# Patient Record
Sex: Male | Born: 1945 | Race: Black or African American | Hispanic: No
Health system: Southern US, Community
[De-identification: ages and names within clinical notes are randomized; demographics above are authoritative.]

---

## 2006-03-24 ENCOUNTER — Other Ambulatory Visit: Payer: Self-pay

## 2006-03-24 ENCOUNTER — Emergency Department: Payer: Self-pay | Admitting: Emergency Medicine

## 2006-09-05 ENCOUNTER — Emergency Department: Payer: Self-pay

## 2006-09-06 ENCOUNTER — Emergency Department: Payer: Self-pay | Admitting: Emergency Medicine

## 2006-09-15 ENCOUNTER — Emergency Department: Payer: Self-pay | Admitting: Emergency Medicine

## 2007-05-24 ENCOUNTER — Emergency Department: Payer: Self-pay | Admitting: Emergency Medicine

## 2007-11-25 ENCOUNTER — Emergency Department: Payer: Self-pay | Admitting: Emergency Medicine

## 2007-12-28 ENCOUNTER — Emergency Department: Payer: Self-pay | Admitting: Internal Medicine

## 2008-12-13 ENCOUNTER — Encounter: Payer: Self-pay | Admitting: General Practice

## 2008-12-19 ENCOUNTER — Encounter: Payer: Self-pay | Admitting: General Practice

## 2010-02-24 ENCOUNTER — Emergency Department: Payer: Self-pay | Admitting: Emergency Medicine

## 2010-09-20 ENCOUNTER — Inpatient Hospital Stay: Payer: Self-pay | Admitting: Internal Medicine

## 2012-01-23 ENCOUNTER — Emergency Department: Payer: Self-pay | Admitting: *Deleted

## 2012-01-23 LAB — URINALYSIS, COMPLETE
Bacteria: NONE SEEN
Bilirubin,UR: NEGATIVE
Blood: NEGATIVE
Glucose,UR: NEGATIVE mg/dL (ref 0–75)
Protein: NEGATIVE
RBC,UR: 1 /HPF (ref 0–5)
Squamous Epithelial: <1

## 2012-01-23 LAB — CBC
MCHC: 34.2 g/dL (ref 32.0–36.0)
MCV: 95 fL (ref 80–100)
Platelet: 188 10*3/uL (ref 150–440)
RBC: 4.43 10*6/uL (ref 4.40–5.90)
WBC: 7 10*3/uL (ref 3.8–10.6)

## 2012-01-23 LAB — COMPREHENSIVE METABOLIC PANEL
Albumin: 3.7 g/dL (ref 3.4–5.0)
Anion Gap: 10 (ref 7–16)
BUN: 20 mg/dL — ABNORMAL HIGH (ref 7–18)
Chloride: 104 mmol/L (ref 98–107)
Co2: 26 mmol/L (ref 21–32)
Creatinine: 1.26 mg/dL (ref 0.60–1.30)
EGFR (African American): >60
EGFR (Non-African Amer.): >60
Glucose: 94 mg/dL (ref 65–99)
SGOT(AST): 8 U/L — ABNORMAL LOW (ref 15–37)
SGPT (ALT): 12 U/L
Total Protein: 6.9 g/dL (ref 6.4–8.2)

## 2012-01-23 LAB — LIPASE, BLOOD: Lipase: 103 U/L (ref 73–393)

## 2012-01-23 LAB — TROPONIN I: Troponin-I: <0.02 ng/mL

## 2013-06-15 ENCOUNTER — Emergency Department: Payer: Self-pay | Admitting: Emergency Medicine

## 2014-11-04 ENCOUNTER — Emergency Department: Payer: Self-pay | Admitting: Emergency Medicine

## 2014-11-22 ENCOUNTER — Emergency Department: Payer: Self-pay | Admitting: Emergency Medicine

## 2014-11-22 DIAGNOSIS — C719 Malignant neoplasm of brain, unspecified: Secondary | ICD-10-CM | POA: Diagnosis not present

## 2014-11-22 DIAGNOSIS — G9389 Other specified disorders of brain: Secondary | ICD-10-CM | POA: Diagnosis not present

## 2014-11-22 DIAGNOSIS — R4182 Altered mental status, unspecified: Secondary | ICD-10-CM | POA: Diagnosis not present

## 2014-11-22 DIAGNOSIS — I10 Essential (primary) hypertension: Secondary | ICD-10-CM | POA: Diagnosis not present

## 2014-11-22 DIAGNOSIS — R220 Localized swelling, mass and lump, head: Secondary | ICD-10-CM | POA: Diagnosis not present

## 2014-11-22 DIAGNOSIS — M6281 Muscle weakness (generalized): Secondary | ICD-10-CM | POA: Diagnosis not present

## 2014-11-22 DIAGNOSIS — R262 Difficulty in walking, not elsewhere classified: Secondary | ICD-10-CM | POA: Diagnosis not present

## 2014-11-22 DIAGNOSIS — F17210 Nicotine dependence, cigarettes, uncomplicated: Secondary | ICD-10-CM | POA: Diagnosis not present

## 2014-11-22 DIAGNOSIS — Z8709 Personal history of other diseases of the respiratory system: Secondary | ICD-10-CM | POA: Diagnosis not present

## 2014-11-22 DIAGNOSIS — F1010 Alcohol abuse, uncomplicated: Secondary | ICD-10-CM | POA: Diagnosis not present

## 2014-11-22 DIAGNOSIS — C801 Malignant (primary) neoplasm, unspecified: Secondary | ICD-10-CM | POA: Diagnosis not present

## 2014-11-22 DIAGNOSIS — Z048 Encounter for examination and observation for other specified reasons: Secondary | ICD-10-CM | POA: Diagnosis not present

## 2014-11-22 DIAGNOSIS — R930 Abnormal findings on diagnostic imaging of skull and head, not elsewhere classified: Secondary | ICD-10-CM | POA: Diagnosis not present

## 2014-11-22 DIAGNOSIS — Z8711 Personal history of peptic ulcer disease: Secondary | ICD-10-CM | POA: Diagnosis not present

## 2014-11-22 DIAGNOSIS — K219 Gastro-esophageal reflux disease without esophagitis: Secondary | ICD-10-CM | POA: Diagnosis not present

## 2014-11-22 DIAGNOSIS — E785 Hyperlipidemia, unspecified: Secondary | ICD-10-CM | POA: Diagnosis not present

## 2014-11-22 DIAGNOSIS — Z720 Tobacco use: Secondary | ICD-10-CM | POA: Diagnosis not present

## 2014-11-22 DIAGNOSIS — J984 Other disorders of lung: Secondary | ICD-10-CM | POA: Diagnosis not present

## 2014-11-22 DIAGNOSIS — Z79899 Other long term (current) drug therapy: Secondary | ICD-10-CM | POA: Diagnosis not present

## 2014-11-22 DIAGNOSIS — R52 Pain, unspecified: Secondary | ICD-10-CM | POA: Diagnosis not present

## 2014-11-22 DIAGNOSIS — G936 Cerebral edema: Secondary | ICD-10-CM | POA: Diagnosis not present

## 2014-11-22 DIAGNOSIS — R410 Disorientation, unspecified: Secondary | ICD-10-CM | POA: Diagnosis not present

## 2014-11-22 DIAGNOSIS — D496 Neoplasm of unspecified behavior of brain: Secondary | ICD-10-CM | POA: Diagnosis not present

## 2014-11-22 DIAGNOSIS — Z5189 Encounter for other specified aftercare: Secondary | ICD-10-CM | POA: Diagnosis not present

## 2014-11-22 DIAGNOSIS — Z803 Family history of malignant neoplasm of breast: Secondary | ICD-10-CM | POA: Diagnosis not present

## 2014-11-22 DIAGNOSIS — G939 Disorder of brain, unspecified: Secondary | ICD-10-CM | POA: Diagnosis not present

## 2014-11-22 DIAGNOSIS — G935 Compression of brain: Secondary | ICD-10-CM | POA: Diagnosis not present

## 2014-11-22 DIAGNOSIS — Z9889 Other specified postprocedural states: Secondary | ICD-10-CM | POA: Diagnosis not present

## 2014-11-22 DIAGNOSIS — F1490 Cocaine use, unspecified, uncomplicated: Secondary | ICD-10-CM | POA: Diagnosis not present

## 2014-11-22 LAB — COMPREHENSIVE METABOLIC PANEL
ALT: 12 U/L — AB
ANION GAP: 6 — AB (ref 7–16)
AST: 17 U/L (ref 15–37)
Albumin: 3.5 g/dL (ref 3.4–5.0)
Alkaline Phosphatase: 105 U/L
BUN: 9 mg/dL (ref 7–18)
Bilirubin,Total: 0.7 mg/dL (ref 0.2–1.0)
CALCIUM: 8.4 mg/dL — AB (ref 8.5–10.1)
CHLORIDE: 108 mmol/L — AB (ref 98–107)
CO2: 27 mmol/L (ref 21–32)
Creatinine: 1.07 mg/dL (ref 0.60–1.30)
Glucose: 89 mg/dL (ref 65–99)
Osmolality: 279 (ref 275–301)
POTASSIUM: 3.7 mmol/L (ref 3.5–5.1)
Sodium: 141 mmol/L (ref 136–145)
TOTAL PROTEIN: 7.5 g/dL (ref 6.4–8.2)

## 2014-11-22 LAB — URINALYSIS, COMPLETE
Bilirubin,UR: NEGATIVE
Blood: NEGATIVE
GLUCOSE, UR: NEGATIVE mg/dL (ref 0–75)
Ketone: NEGATIVE
Nitrite: NEGATIVE
Ph: 5 (ref 4.5–8.0)
Protein: NEGATIVE
Specific Gravity: 1.012 (ref 1.003–1.030)
Squamous Epithelial: <1
WBC UR: 10 /HPF (ref 0–5)

## 2014-11-22 LAB — ETHANOL: Ethanol: <3 mg/dL

## 2014-11-22 LAB — DRUG SCREEN, URINE

## 2014-11-22 LAB — CBC
HCT: 37.7 % — ABNORMAL LOW (ref 40.0–52.0)
HGB: 12.4 g/dL — AB (ref 13.0–18.0)
MCH: 30.1 pg (ref 26.0–34.0)
MCHC: 32.9 g/dL (ref 32.0–36.0)
MCV: 92 fL (ref 80–100)
PLATELETS: 229 10*3/uL (ref 150–440)
RBC: 4.12 10*6/uL — AB (ref 4.40–5.90)
RDW: 14.6 % — ABNORMAL HIGH (ref 11.5–14.5)
WBC: 8.7 10*3/uL (ref 3.8–10.6)

## 2014-11-22 LAB — PROTIME-INR
INR: 1
Prothrombin Time: 13.1 secs (ref 11.5–14.7)

## 2014-11-22 LAB — TROPONIN I: Troponin-I: <0.02 ng/mL

## 2014-11-22 LAB — AMMONIA: Ammonia, Plasma: 12 mcmol/L (ref 11–32)

## 2014-11-22 LAB — TSH: Thyroid Stimulating Horm: 0.511 u[IU]/mL

## 2014-11-30 DIAGNOSIS — Z5189 Encounter for other specified aftercare: Secondary | ICD-10-CM | POA: Diagnosis not present

## 2014-11-30 DIAGNOSIS — Z720 Tobacco use: Secondary | ICD-10-CM | POA: Diagnosis not present

## 2014-11-30 DIAGNOSIS — M6281 Muscle weakness (generalized): Secondary | ICD-10-CM | POA: Diagnosis not present

## 2014-11-30 DIAGNOSIS — I10 Essential (primary) hypertension: Secondary | ICD-10-CM | POA: Diagnosis not present

## 2014-11-30 DIAGNOSIS — C801 Malignant (primary) neoplasm, unspecified: Secondary | ICD-10-CM | POA: Diagnosis not present

## 2014-11-30 DIAGNOSIS — R262 Difficulty in walking, not elsewhere classified: Secondary | ICD-10-CM | POA: Diagnosis not present

## 2014-11-30 DIAGNOSIS — E784 Other hyperlipidemia: Secondary | ICD-10-CM | POA: Diagnosis not present

## 2014-11-30 DIAGNOSIS — C719 Malignant neoplasm of brain, unspecified: Secondary | ICD-10-CM | POA: Diagnosis not present

## 2014-11-30 DIAGNOSIS — E785 Hyperlipidemia, unspecified: Secondary | ICD-10-CM | POA: Diagnosis not present

## 2014-11-30 DIAGNOSIS — Z4802 Encounter for removal of sutures: Secondary | ICD-10-CM | POA: Diagnosis not present

## 2014-11-30 DIAGNOSIS — K219 Gastro-esophageal reflux disease without esophagitis: Secondary | ICD-10-CM | POA: Diagnosis not present

## 2014-12-04 DIAGNOSIS — E784 Other hyperlipidemia: Secondary | ICD-10-CM | POA: Diagnosis not present

## 2014-12-04 DIAGNOSIS — I10 Essential (primary) hypertension: Secondary | ICD-10-CM | POA: Diagnosis not present

## 2014-12-07 DIAGNOSIS — Z4802 Encounter for removal of sutures: Secondary | ICD-10-CM | POA: Diagnosis not present

## 2014-12-10 ENCOUNTER — Ambulatory Visit: Payer: Self-pay | Admitting: Family Medicine

## 2014-12-10 LAB — CBC WITH DIFFERENTIAL/PLATELET
BANDS NEUTROPHIL: 1 %
Basophil: 1 %
HCT: 34.4 % — ABNORMAL LOW (ref 40.0–52.0)
HGB: 11.2 g/dL — AB (ref 13.0–18.0)
LYMPHS PCT: 22 %
MCH: 30.5 pg (ref 26.0–34.0)
MCHC: 32.5 g/dL (ref 32.0–36.0)
MCV: 94 fL (ref 80–100)
MONOS PCT: 5 %
NRBC/100 WBC: 1 /
Platelet: 219 10*3/uL (ref 150–440)
RBC: 3.66 10*6/uL — AB (ref 4.40–5.90)
RDW: 16.3 % — ABNORMAL HIGH (ref 11.5–14.5)
Segmented Neutrophils: 71 %
WBC: 12.2 10*3/uL — ABNORMAL HIGH (ref 3.8–10.6)

## 2014-12-10 LAB — COMPREHENSIVE METABOLIC PANEL
ALBUMIN: 2.8 g/dL — AB (ref 3.4–5.0)
ALK PHOS: 109 U/L
ALT: 55 U/L
Anion Gap: 12 (ref 7–16)
BUN: 20 mg/dL — ABNORMAL HIGH (ref 7–18)
Bilirubin,Total: 0.4 mg/dL (ref 0.2–1.0)
CREATININE: 0.98 mg/dL (ref 0.60–1.30)
Calcium, Total: 8.3 mg/dL — ABNORMAL LOW (ref 8.5–10.1)
Chloride: 103 mmol/L (ref 98–107)
Co2: 25 mmol/L (ref 21–32)
EGFR (African American): >60
EGFR (Non-African Amer.): >60
GLUCOSE: 96 mg/dL (ref 65–99)
OSMOLALITY: 282 (ref 275–301)
Potassium: 4.2 mmol/L (ref 3.5–5.1)
SGOT(AST): 24 U/L (ref 15–37)
Sodium: 140 mmol/L (ref 136–145)
Total Protein: 6 g/dL — ABNORMAL LOW (ref 6.4–8.2)

## 2014-12-12 DIAGNOSIS — E784 Other hyperlipidemia: Secondary | ICD-10-CM | POA: Diagnosis not present

## 2014-12-12 DIAGNOSIS — I10 Essential (primary) hypertension: Secondary | ICD-10-CM | POA: Diagnosis not present

## 2014-12-12 DIAGNOSIS — C719 Malignant neoplasm of brain, unspecified: Secondary | ICD-10-CM | POA: Diagnosis not present

## 2014-12-21 DIAGNOSIS — E784 Other hyperlipidemia: Secondary | ICD-10-CM | POA: Diagnosis not present

## 2014-12-21 DIAGNOSIS — C719 Malignant neoplasm of brain, unspecified: Secondary | ICD-10-CM | POA: Diagnosis not present

## 2014-12-21 DIAGNOSIS — I10 Essential (primary) hypertension: Secondary | ICD-10-CM | POA: Diagnosis not present

## 2014-12-22 DIAGNOSIS — C719 Malignant neoplasm of brain, unspecified: Secondary | ICD-10-CM | POA: Diagnosis not present

## 2014-12-22 DIAGNOSIS — I5021 Acute systolic (congestive) heart failure: Secondary | ICD-10-CM | POA: Diagnosis not present

## 2014-12-22 DIAGNOSIS — J90 Pleural effusion, not elsewhere classified: Secondary | ICD-10-CM | POA: Diagnosis not present

## 2014-12-23 DIAGNOSIS — M6281 Muscle weakness (generalized): Secondary | ICD-10-CM | POA: Diagnosis not present

## 2014-12-23 DIAGNOSIS — C799 Secondary malignant neoplasm of unspecified site: Secondary | ICD-10-CM | POA: Diagnosis not present

## 2014-12-23 DIAGNOSIS — C719 Malignant neoplasm of brain, unspecified: Secondary | ICD-10-CM | POA: Diagnosis not present

## 2014-12-24 DIAGNOSIS — E785 Hyperlipidemia, unspecified: Secondary | ICD-10-CM | POA: Diagnosis not present

## 2014-12-24 DIAGNOSIS — D649 Anemia, unspecified: Secondary | ICD-10-CM | POA: Diagnosis not present

## 2014-12-26 DIAGNOSIS — R079 Chest pain, unspecified: Secondary | ICD-10-CM | POA: Diagnosis not present

## 2014-12-26 DIAGNOSIS — R05 Cough: Secondary | ICD-10-CM | POA: Diagnosis not present

## 2015-01-04 ENCOUNTER — Ambulatory Visit: Payer: Self-pay | Admitting: Hematology and Oncology

## 2015-01-04 DIAGNOSIS — Z8711 Personal history of peptic ulcer disease: Secondary | ICD-10-CM | POA: Diagnosis not present

## 2015-01-04 DIAGNOSIS — C719 Malignant neoplasm of brain, unspecified: Secondary | ICD-10-CM | POA: Diagnosis not present

## 2015-01-04 DIAGNOSIS — F17210 Nicotine dependence, cigarettes, uncomplicated: Secondary | ICD-10-CM | POA: Diagnosis not present

## 2015-01-04 DIAGNOSIS — E780 Pure hypercholesterolemia: Secondary | ICD-10-CM | POA: Diagnosis not present

## 2015-01-04 DIAGNOSIS — R011 Cardiac murmur, unspecified: Secondary | ICD-10-CM | POA: Diagnosis not present

## 2015-01-04 DIAGNOSIS — Z510 Encounter for antineoplastic radiation therapy: Secondary | ICD-10-CM | POA: Diagnosis not present

## 2015-01-04 DIAGNOSIS — Z79899 Other long term (current) drug therapy: Secondary | ICD-10-CM | POA: Diagnosis not present

## 2015-01-04 DIAGNOSIS — Z8709 Personal history of other diseases of the respiratory system: Secondary | ICD-10-CM | POA: Diagnosis not present

## 2015-01-04 DIAGNOSIS — Z801 Family history of malignant neoplasm of trachea, bronchus and lung: Secondary | ICD-10-CM | POA: Diagnosis not present

## 2015-01-04 DIAGNOSIS — I10 Essential (primary) hypertension: Secondary | ICD-10-CM | POA: Diagnosis not present

## 2015-01-04 DIAGNOSIS — R0602 Shortness of breath: Secondary | ICD-10-CM | POA: Diagnosis not present

## 2015-01-04 DIAGNOSIS — Z8042 Family history of malignant neoplasm of prostate: Secondary | ICD-10-CM | POA: Diagnosis not present

## 2015-01-04 DIAGNOSIS — R531 Weakness: Secondary | ICD-10-CM | POA: Diagnosis not present

## 2015-01-04 DIAGNOSIS — B379 Candidiasis, unspecified: Secondary | ICD-10-CM | POA: Diagnosis not present

## 2015-01-04 DIAGNOSIS — K219 Gastro-esophageal reflux disease without esophagitis: Secondary | ICD-10-CM | POA: Diagnosis not present

## 2015-01-04 DIAGNOSIS — I252 Old myocardial infarction: Secondary | ICD-10-CM | POA: Diagnosis not present

## 2015-01-04 DIAGNOSIS — Z800 Family history of malignant neoplasm of digestive organs: Secondary | ICD-10-CM | POA: Diagnosis not present

## 2015-01-05 DIAGNOSIS — I10 Essential (primary) hypertension: Secondary | ICD-10-CM | POA: Diagnosis not present

## 2015-01-05 DIAGNOSIS — E784 Other hyperlipidemia: Secondary | ICD-10-CM | POA: Diagnosis not present

## 2015-01-11 DIAGNOSIS — F17210 Nicotine dependence, cigarettes, uncomplicated: Secondary | ICD-10-CM | POA: Diagnosis not present

## 2015-01-11 DIAGNOSIS — R011 Cardiac murmur, unspecified: Secondary | ICD-10-CM | POA: Diagnosis not present

## 2015-01-11 DIAGNOSIS — Z8042 Family history of malignant neoplasm of prostate: Secondary | ICD-10-CM | POA: Diagnosis not present

## 2015-01-11 DIAGNOSIS — Z800 Family history of malignant neoplasm of digestive organs: Secondary | ICD-10-CM | POA: Diagnosis not present

## 2015-01-11 DIAGNOSIS — R0602 Shortness of breath: Secondary | ICD-10-CM | POA: Diagnosis not present

## 2015-01-11 DIAGNOSIS — B379 Candidiasis, unspecified: Secondary | ICD-10-CM | POA: Diagnosis not present

## 2015-01-11 DIAGNOSIS — R531 Weakness: Secondary | ICD-10-CM | POA: Diagnosis not present

## 2015-01-11 DIAGNOSIS — Z510 Encounter for antineoplastic radiation therapy: Secondary | ICD-10-CM | POA: Diagnosis not present

## 2015-01-11 DIAGNOSIS — C719 Malignant neoplasm of brain, unspecified: Secondary | ICD-10-CM | POA: Diagnosis not present

## 2015-01-11 DIAGNOSIS — Z8709 Personal history of other diseases of the respiratory system: Secondary | ICD-10-CM | POA: Diagnosis not present

## 2015-01-11 DIAGNOSIS — E780 Pure hypercholesterolemia: Secondary | ICD-10-CM | POA: Diagnosis not present

## 2015-01-11 DIAGNOSIS — K219 Gastro-esophageal reflux disease without esophagitis: Secondary | ICD-10-CM | POA: Diagnosis not present

## 2015-01-11 DIAGNOSIS — Z801 Family history of malignant neoplasm of trachea, bronchus and lung: Secondary | ICD-10-CM | POA: Diagnosis not present

## 2015-01-11 DIAGNOSIS — I10 Essential (primary) hypertension: Secondary | ICD-10-CM | POA: Diagnosis not present

## 2015-01-11 DIAGNOSIS — I252 Old myocardial infarction: Secondary | ICD-10-CM | POA: Diagnosis not present

## 2015-01-11 DIAGNOSIS — Z8711 Personal history of peptic ulcer disease: Secondary | ICD-10-CM | POA: Diagnosis not present

## 2015-01-11 DIAGNOSIS — Z79899 Other long term (current) drug therapy: Secondary | ICD-10-CM | POA: Diagnosis not present

## 2015-01-12 DIAGNOSIS — Z800 Family history of malignant neoplasm of digestive organs: Secondary | ICD-10-CM | POA: Diagnosis not present

## 2015-01-12 DIAGNOSIS — Z510 Encounter for antineoplastic radiation therapy: Secondary | ICD-10-CM | POA: Diagnosis not present

## 2015-01-12 DIAGNOSIS — R0602 Shortness of breath: Secondary | ICD-10-CM | POA: Diagnosis not present

## 2015-01-12 DIAGNOSIS — Z8042 Family history of malignant neoplasm of prostate: Secondary | ICD-10-CM | POA: Diagnosis not present

## 2015-01-12 DIAGNOSIS — Z801 Family history of malignant neoplasm of trachea, bronchus and lung: Secondary | ICD-10-CM | POA: Diagnosis not present

## 2015-01-12 DIAGNOSIS — F17210 Nicotine dependence, cigarettes, uncomplicated: Secondary | ICD-10-CM | POA: Diagnosis not present

## 2015-01-12 DIAGNOSIS — Z8709 Personal history of other diseases of the respiratory system: Secondary | ICD-10-CM | POA: Diagnosis not present

## 2015-01-12 DIAGNOSIS — E780 Pure hypercholesterolemia: Secondary | ICD-10-CM | POA: Diagnosis not present

## 2015-01-12 DIAGNOSIS — I252 Old myocardial infarction: Secondary | ICD-10-CM | POA: Diagnosis not present

## 2015-01-12 DIAGNOSIS — Z8711 Personal history of peptic ulcer disease: Secondary | ICD-10-CM | POA: Diagnosis not present

## 2015-01-12 DIAGNOSIS — R531 Weakness: Secondary | ICD-10-CM | POA: Diagnosis not present

## 2015-01-12 DIAGNOSIS — B379 Candidiasis, unspecified: Secondary | ICD-10-CM | POA: Diagnosis not present

## 2015-01-12 DIAGNOSIS — K219 Gastro-esophageal reflux disease without esophagitis: Secondary | ICD-10-CM | POA: Diagnosis not present

## 2015-01-12 DIAGNOSIS — C719 Malignant neoplasm of brain, unspecified: Secondary | ICD-10-CM | POA: Diagnosis not present

## 2015-01-12 DIAGNOSIS — R011 Cardiac murmur, unspecified: Secondary | ICD-10-CM | POA: Diagnosis not present

## 2015-01-12 DIAGNOSIS — Z79899 Other long term (current) drug therapy: Secondary | ICD-10-CM | POA: Diagnosis not present

## 2015-01-12 DIAGNOSIS — I10 Essential (primary) hypertension: Secondary | ICD-10-CM | POA: Diagnosis not present

## 2015-01-17 ENCOUNTER — Ambulatory Visit
Admit: 2015-01-17 | Disposition: A | Discharge status: Home / Self Care | Payer: Self-pay | Attending: Hematology and Oncology | Admitting: Hematology and Oncology

## 2015-01-17 DIAGNOSIS — C719 Malignant neoplasm of brain, unspecified: Secondary | ICD-10-CM | POA: Diagnosis not present

## 2015-01-17 DIAGNOSIS — I10 Essential (primary) hypertension: Secondary | ICD-10-CM | POA: Diagnosis not present

## 2015-01-17 DIAGNOSIS — E784 Other hyperlipidemia: Secondary | ICD-10-CM | POA: Diagnosis not present

## 2015-01-18 DIAGNOSIS — Z79899 Other long term (current) drug therapy: Secondary | ICD-10-CM | POA: Diagnosis not present

## 2015-01-18 DIAGNOSIS — Z1159 Encounter for screening for other viral diseases: Secondary | ICD-10-CM | POA: Diagnosis not present

## 2015-01-18 DIAGNOSIS — R011 Cardiac murmur, unspecified: Secondary | ICD-10-CM | POA: Diagnosis not present

## 2015-01-18 DIAGNOSIS — E871 Hypo-osmolality and hyponatremia: Secondary | ICD-10-CM | POA: Diagnosis not present

## 2015-01-18 DIAGNOSIS — R531 Weakness: Secondary | ICD-10-CM | POA: Diagnosis not present

## 2015-01-18 DIAGNOSIS — E860 Dehydration: Secondary | ICD-10-CM | POA: Diagnosis not present

## 2015-01-18 DIAGNOSIS — Z8042 Family history of malignant neoplasm of prostate: Secondary | ICD-10-CM | POA: Diagnosis not present

## 2015-01-18 DIAGNOSIS — Z8711 Personal history of peptic ulcer disease: Secondary | ICD-10-CM | POA: Diagnosis not present

## 2015-01-18 DIAGNOSIS — N390 Urinary tract infection, site not specified: Secondary | ICD-10-CM | POA: Diagnosis not present

## 2015-01-18 DIAGNOSIS — Z801 Family history of malignant neoplasm of trachea, bronchus and lung: Secondary | ICD-10-CM | POA: Diagnosis not present

## 2015-01-18 DIAGNOSIS — M2550 Pain in unspecified joint: Secondary | ICD-10-CM | POA: Diagnosis not present

## 2015-01-18 DIAGNOSIS — R4182 Altered mental status, unspecified: Secondary | ICD-10-CM | POA: Diagnosis not present

## 2015-01-18 DIAGNOSIS — R197 Diarrhea, unspecified: Secondary | ICD-10-CM | POA: Diagnosis not present

## 2015-01-18 DIAGNOSIS — C719 Malignant neoplasm of brain, unspecified: Secondary | ICD-10-CM | POA: Diagnosis not present

## 2015-01-18 DIAGNOSIS — R7989 Other specified abnormal findings of blood chemistry: Secondary | ICD-10-CM | POA: Diagnosis not present

## 2015-01-18 DIAGNOSIS — I10 Essential (primary) hypertension: Secondary | ICD-10-CM | POA: Diagnosis not present

## 2015-01-18 DIAGNOSIS — K219 Gastro-esophageal reflux disease without esophagitis: Secondary | ICD-10-CM | POA: Diagnosis not present

## 2015-01-18 DIAGNOSIS — I252 Old myocardial infarction: Secondary | ICD-10-CM | POA: Diagnosis not present

## 2015-01-18 DIAGNOSIS — E780 Pure hypercholesterolemia: Secondary | ICD-10-CM | POA: Diagnosis not present

## 2015-01-18 DIAGNOSIS — F17210 Nicotine dependence, cigarettes, uncomplicated: Secondary | ICD-10-CM | POA: Diagnosis not present

## 2015-01-18 DIAGNOSIS — R109 Unspecified abdominal pain: Secondary | ICD-10-CM | POA: Diagnosis not present

## 2015-01-18 DIAGNOSIS — R140 Abdominal distension (gaseous): Secondary | ICD-10-CM | POA: Diagnosis not present

## 2015-01-20 DIAGNOSIS — Z8042 Family history of malignant neoplasm of prostate: Secondary | ICD-10-CM | POA: Diagnosis not present

## 2015-01-20 DIAGNOSIS — E860 Dehydration: Secondary | ICD-10-CM | POA: Diagnosis not present

## 2015-01-20 DIAGNOSIS — E780 Pure hypercholesterolemia: Secondary | ICD-10-CM | POA: Diagnosis not present

## 2015-01-20 DIAGNOSIS — I10 Essential (primary) hypertension: Secondary | ICD-10-CM | POA: Diagnosis not present

## 2015-01-20 DIAGNOSIS — R011 Cardiac murmur, unspecified: Secondary | ICD-10-CM | POA: Diagnosis not present

## 2015-01-20 DIAGNOSIS — Z8711 Personal history of peptic ulcer disease: Secondary | ICD-10-CM | POA: Diagnosis not present

## 2015-01-20 DIAGNOSIS — I252 Old myocardial infarction: Secondary | ICD-10-CM | POA: Diagnosis not present

## 2015-01-20 DIAGNOSIS — F17210 Nicotine dependence, cigarettes, uncomplicated: Secondary | ICD-10-CM | POA: Diagnosis not present

## 2015-01-20 DIAGNOSIS — E871 Hypo-osmolality and hyponatremia: Secondary | ICD-10-CM | POA: Diagnosis not present

## 2015-01-20 DIAGNOSIS — Z801 Family history of malignant neoplasm of trachea, bronchus and lung: Secondary | ICD-10-CM | POA: Diagnosis not present

## 2015-01-20 DIAGNOSIS — K219 Gastro-esophageal reflux disease without esophagitis: Secondary | ICD-10-CM | POA: Diagnosis not present

## 2015-01-20 DIAGNOSIS — R109 Unspecified abdominal pain: Secondary | ICD-10-CM | POA: Diagnosis not present

## 2015-01-20 DIAGNOSIS — R7989 Other specified abnormal findings of blood chemistry: Secondary | ICD-10-CM | POA: Diagnosis not present

## 2015-01-20 DIAGNOSIS — R531 Weakness: Secondary | ICD-10-CM | POA: Diagnosis not present

## 2015-01-20 DIAGNOSIS — N390 Urinary tract infection, site not specified: Secondary | ICD-10-CM | POA: Diagnosis not present

## 2015-01-20 DIAGNOSIS — R140 Abdominal distension (gaseous): Secondary | ICD-10-CM | POA: Diagnosis not present

## 2015-01-20 DIAGNOSIS — Z79899 Other long term (current) drug therapy: Secondary | ICD-10-CM | POA: Diagnosis not present

## 2015-01-20 DIAGNOSIS — R197 Diarrhea, unspecified: Secondary | ICD-10-CM | POA: Diagnosis not present

## 2015-01-20 DIAGNOSIS — R4182 Altered mental status, unspecified: Secondary | ICD-10-CM | POA: Diagnosis not present

## 2015-01-20 DIAGNOSIS — C719 Malignant neoplasm of brain, unspecified: Secondary | ICD-10-CM | POA: Diagnosis not present

## 2015-01-20 DIAGNOSIS — Z1159 Encounter for screening for other viral diseases: Secondary | ICD-10-CM | POA: Diagnosis not present

## 2015-01-21 DIAGNOSIS — M2550 Pain in unspecified joint: Secondary | ICD-10-CM | POA: Diagnosis not present

## 2015-01-23 DIAGNOSIS — F17210 Nicotine dependence, cigarettes, uncomplicated: Secondary | ICD-10-CM | POA: Diagnosis not present

## 2015-01-23 DIAGNOSIS — Z8711 Personal history of peptic ulcer disease: Secondary | ICD-10-CM | POA: Diagnosis not present

## 2015-01-23 DIAGNOSIS — R531 Weakness: Secondary | ICD-10-CM | POA: Diagnosis not present

## 2015-01-23 DIAGNOSIS — C719 Malignant neoplasm of brain, unspecified: Secondary | ICD-10-CM | POA: Diagnosis not present

## 2015-01-23 DIAGNOSIS — Z801 Family history of malignant neoplasm of trachea, bronchus and lung: Secondary | ICD-10-CM | POA: Diagnosis not present

## 2015-01-23 DIAGNOSIS — Z8042 Family history of malignant neoplasm of prostate: Secondary | ICD-10-CM | POA: Diagnosis not present

## 2015-01-23 DIAGNOSIS — N390 Urinary tract infection, site not specified: Secondary | ICD-10-CM | POA: Diagnosis not present

## 2015-01-23 DIAGNOSIS — R140 Abdominal distension (gaseous): Secondary | ICD-10-CM | POA: Diagnosis not present

## 2015-01-23 DIAGNOSIS — I10 Essential (primary) hypertension: Secondary | ICD-10-CM | POA: Diagnosis not present

## 2015-01-23 DIAGNOSIS — E780 Pure hypercholesterolemia: Secondary | ICD-10-CM | POA: Diagnosis not present

## 2015-01-23 DIAGNOSIS — R4182 Altered mental status, unspecified: Secondary | ICD-10-CM | POA: Diagnosis not present

## 2015-01-23 DIAGNOSIS — R011 Cardiac murmur, unspecified: Secondary | ICD-10-CM | POA: Diagnosis not present

## 2015-01-23 DIAGNOSIS — Z79899 Other long term (current) drug therapy: Secondary | ICD-10-CM | POA: Diagnosis not present

## 2015-01-23 DIAGNOSIS — K219 Gastro-esophageal reflux disease without esophagitis: Secondary | ICD-10-CM | POA: Diagnosis not present

## 2015-01-23 DIAGNOSIS — E871 Hypo-osmolality and hyponatremia: Secondary | ICD-10-CM | POA: Diagnosis not present

## 2015-01-23 DIAGNOSIS — R7989 Other specified abnormal findings of blood chemistry: Secondary | ICD-10-CM | POA: Diagnosis not present

## 2015-01-23 DIAGNOSIS — R197 Diarrhea, unspecified: Secondary | ICD-10-CM | POA: Diagnosis not present

## 2015-01-23 DIAGNOSIS — E860 Dehydration: Secondary | ICD-10-CM | POA: Diagnosis not present

## 2015-01-23 DIAGNOSIS — R109 Unspecified abdominal pain: Secondary | ICD-10-CM | POA: Diagnosis not present

## 2015-01-23 DIAGNOSIS — Z1159 Encounter for screening for other viral diseases: Secondary | ICD-10-CM | POA: Diagnosis not present

## 2015-01-23 DIAGNOSIS — I252 Old myocardial infarction: Secondary | ICD-10-CM | POA: Diagnosis not present

## 2015-01-24 DIAGNOSIS — R109 Unspecified abdominal pain: Secondary | ICD-10-CM | POA: Diagnosis not present

## 2015-01-24 DIAGNOSIS — Z8711 Personal history of peptic ulcer disease: Secondary | ICD-10-CM | POA: Diagnosis not present

## 2015-01-24 DIAGNOSIS — I252 Old myocardial infarction: Secondary | ICD-10-CM | POA: Diagnosis not present

## 2015-01-24 DIAGNOSIS — Z801 Family history of malignant neoplasm of trachea, bronchus and lung: Secondary | ICD-10-CM | POA: Diagnosis not present

## 2015-01-24 DIAGNOSIS — K219 Gastro-esophageal reflux disease without esophagitis: Secondary | ICD-10-CM | POA: Diagnosis not present

## 2015-01-24 DIAGNOSIS — R4182 Altered mental status, unspecified: Secondary | ICD-10-CM | POA: Diagnosis not present

## 2015-01-24 DIAGNOSIS — F17210 Nicotine dependence, cigarettes, uncomplicated: Secondary | ICD-10-CM | POA: Diagnosis not present

## 2015-01-24 DIAGNOSIS — Z8042 Family history of malignant neoplasm of prostate: Secondary | ICD-10-CM | POA: Diagnosis not present

## 2015-01-24 DIAGNOSIS — R140 Abdominal distension (gaseous): Secondary | ICD-10-CM | POA: Diagnosis not present

## 2015-01-24 DIAGNOSIS — Z79899 Other long term (current) drug therapy: Secondary | ICD-10-CM | POA: Diagnosis not present

## 2015-01-24 DIAGNOSIS — E780 Pure hypercholesterolemia: Secondary | ICD-10-CM | POA: Diagnosis not present

## 2015-01-24 DIAGNOSIS — C719 Malignant neoplasm of brain, unspecified: Secondary | ICD-10-CM | POA: Diagnosis not present

## 2015-01-24 DIAGNOSIS — E871 Hypo-osmolality and hyponatremia: Secondary | ICD-10-CM | POA: Diagnosis not present

## 2015-01-24 DIAGNOSIS — R531 Weakness: Secondary | ICD-10-CM | POA: Diagnosis not present

## 2015-01-24 DIAGNOSIS — R7989 Other specified abnormal findings of blood chemistry: Secondary | ICD-10-CM | POA: Diagnosis not present

## 2015-01-24 DIAGNOSIS — Z1159 Encounter for screening for other viral diseases: Secondary | ICD-10-CM | POA: Diagnosis not present

## 2015-01-24 DIAGNOSIS — R011 Cardiac murmur, unspecified: Secondary | ICD-10-CM | POA: Diagnosis not present

## 2015-01-24 DIAGNOSIS — I10 Essential (primary) hypertension: Secondary | ICD-10-CM | POA: Diagnosis not present

## 2015-01-24 DIAGNOSIS — N390 Urinary tract infection, site not specified: Secondary | ICD-10-CM | POA: Diagnosis not present

## 2015-01-24 DIAGNOSIS — R197 Diarrhea, unspecified: Secondary | ICD-10-CM | POA: Diagnosis not present

## 2015-01-24 DIAGNOSIS — E860 Dehydration: Secondary | ICD-10-CM | POA: Diagnosis not present

## 2015-01-25 DIAGNOSIS — K219 Gastro-esophageal reflux disease without esophagitis: Secondary | ICD-10-CM | POA: Diagnosis not present

## 2015-01-25 DIAGNOSIS — R197 Diarrhea, unspecified: Secondary | ICD-10-CM | POA: Diagnosis not present

## 2015-01-25 DIAGNOSIS — Z801 Family history of malignant neoplasm of trachea, bronchus and lung: Secondary | ICD-10-CM | POA: Diagnosis not present

## 2015-01-25 DIAGNOSIS — C719 Malignant neoplasm of brain, unspecified: Secondary | ICD-10-CM | POA: Diagnosis not present

## 2015-01-25 DIAGNOSIS — R140 Abdominal distension (gaseous): Secondary | ICD-10-CM | POA: Diagnosis not present

## 2015-01-25 DIAGNOSIS — R531 Weakness: Secondary | ICD-10-CM | POA: Diagnosis not present

## 2015-01-25 DIAGNOSIS — R011 Cardiac murmur, unspecified: Secondary | ICD-10-CM | POA: Diagnosis not present

## 2015-01-25 DIAGNOSIS — N390 Urinary tract infection, site not specified: Secondary | ICD-10-CM | POA: Diagnosis not present

## 2015-01-25 DIAGNOSIS — E860 Dehydration: Secondary | ICD-10-CM | POA: Diagnosis not present

## 2015-01-25 DIAGNOSIS — Z79899 Other long term (current) drug therapy: Secondary | ICD-10-CM | POA: Diagnosis not present

## 2015-01-25 DIAGNOSIS — Z7901 Long term (current) use of anticoagulants: Secondary | ICD-10-CM | POA: Diagnosis not present

## 2015-01-25 DIAGNOSIS — I10 Essential (primary) hypertension: Secondary | ICD-10-CM | POA: Diagnosis not present

## 2015-01-25 DIAGNOSIS — R109 Unspecified abdominal pain: Secondary | ICD-10-CM | POA: Diagnosis not present

## 2015-01-25 DIAGNOSIS — E780 Pure hypercholesterolemia: Secondary | ICD-10-CM | POA: Diagnosis not present

## 2015-01-25 DIAGNOSIS — Z1159 Encounter for screening for other viral diseases: Secondary | ICD-10-CM | POA: Diagnosis not present

## 2015-01-25 DIAGNOSIS — F17210 Nicotine dependence, cigarettes, uncomplicated: Secondary | ICD-10-CM | POA: Diagnosis not present

## 2015-01-25 DIAGNOSIS — R4182 Altered mental status, unspecified: Secondary | ICD-10-CM | POA: Diagnosis not present

## 2015-01-25 DIAGNOSIS — Z8042 Family history of malignant neoplasm of prostate: Secondary | ICD-10-CM | POA: Diagnosis not present

## 2015-01-25 DIAGNOSIS — I252 Old myocardial infarction: Secondary | ICD-10-CM | POA: Diagnosis not present

## 2015-01-25 DIAGNOSIS — Z8711 Personal history of peptic ulcer disease: Secondary | ICD-10-CM | POA: Diagnosis not present

## 2015-01-25 DIAGNOSIS — R7989 Other specified abnormal findings of blood chemistry: Secondary | ICD-10-CM | POA: Diagnosis not present

## 2015-01-25 DIAGNOSIS — E871 Hypo-osmolality and hyponatremia: Secondary | ICD-10-CM | POA: Diagnosis not present

## 2015-01-26 DIAGNOSIS — R140 Abdominal distension (gaseous): Secondary | ICD-10-CM | POA: Diagnosis not present

## 2015-01-26 DIAGNOSIS — R011 Cardiac murmur, unspecified: Secondary | ICD-10-CM | POA: Diagnosis not present

## 2015-01-26 DIAGNOSIS — E871 Hypo-osmolality and hyponatremia: Secondary | ICD-10-CM | POA: Diagnosis not present

## 2015-01-26 DIAGNOSIS — R197 Diarrhea, unspecified: Secondary | ICD-10-CM | POA: Diagnosis not present

## 2015-01-26 DIAGNOSIS — F17210 Nicotine dependence, cigarettes, uncomplicated: Secondary | ICD-10-CM | POA: Diagnosis not present

## 2015-01-26 DIAGNOSIS — R4182 Altered mental status, unspecified: Secondary | ICD-10-CM | POA: Diagnosis not present

## 2015-01-26 DIAGNOSIS — Z8711 Personal history of peptic ulcer disease: Secondary | ICD-10-CM | POA: Diagnosis not present

## 2015-01-26 DIAGNOSIS — R109 Unspecified abdominal pain: Secondary | ICD-10-CM | POA: Diagnosis not present

## 2015-01-26 DIAGNOSIS — I10 Essential (primary) hypertension: Secondary | ICD-10-CM | POA: Diagnosis not present

## 2015-01-26 DIAGNOSIS — Z1159 Encounter for screening for other viral diseases: Secondary | ICD-10-CM | POA: Diagnosis not present

## 2015-01-26 DIAGNOSIS — K219 Gastro-esophageal reflux disease without esophagitis: Secondary | ICD-10-CM | POA: Diagnosis not present

## 2015-01-26 DIAGNOSIS — Z79899 Other long term (current) drug therapy: Secondary | ICD-10-CM | POA: Diagnosis not present

## 2015-01-26 DIAGNOSIS — E860 Dehydration: Secondary | ICD-10-CM | POA: Diagnosis not present

## 2015-01-26 DIAGNOSIS — I252 Old myocardial infarction: Secondary | ICD-10-CM | POA: Diagnosis not present

## 2015-01-26 DIAGNOSIS — R531 Weakness: Secondary | ICD-10-CM | POA: Diagnosis not present

## 2015-01-26 DIAGNOSIS — Z8042 Family history of malignant neoplasm of prostate: Secondary | ICD-10-CM | POA: Diagnosis not present

## 2015-01-26 DIAGNOSIS — N390 Urinary tract infection, site not specified: Secondary | ICD-10-CM | POA: Diagnosis not present

## 2015-01-26 DIAGNOSIS — Z801 Family history of malignant neoplasm of trachea, bronchus and lung: Secondary | ICD-10-CM | POA: Diagnosis not present

## 2015-01-26 DIAGNOSIS — C719 Malignant neoplasm of brain, unspecified: Secondary | ICD-10-CM | POA: Diagnosis not present

## 2015-01-26 DIAGNOSIS — R7989 Other specified abnormal findings of blood chemistry: Secondary | ICD-10-CM | POA: Diagnosis not present

## 2015-01-26 DIAGNOSIS — E780 Pure hypercholesterolemia: Secondary | ICD-10-CM | POA: Diagnosis not present

## 2015-01-27 DIAGNOSIS — R011 Cardiac murmur, unspecified: Secondary | ICD-10-CM | POA: Diagnosis not present

## 2015-01-27 DIAGNOSIS — Z8711 Personal history of peptic ulcer disease: Secondary | ICD-10-CM | POA: Diagnosis not present

## 2015-01-27 DIAGNOSIS — N390 Urinary tract infection, site not specified: Secondary | ICD-10-CM | POA: Diagnosis not present

## 2015-01-27 DIAGNOSIS — E860 Dehydration: Secondary | ICD-10-CM | POA: Diagnosis not present

## 2015-01-27 DIAGNOSIS — E780 Pure hypercholesterolemia: Secondary | ICD-10-CM | POA: Diagnosis not present

## 2015-01-27 DIAGNOSIS — R140 Abdominal distension (gaseous): Secondary | ICD-10-CM | POA: Diagnosis not present

## 2015-01-27 DIAGNOSIS — I252 Old myocardial infarction: Secondary | ICD-10-CM | POA: Diagnosis not present

## 2015-01-27 DIAGNOSIS — Z801 Family history of malignant neoplasm of trachea, bronchus and lung: Secondary | ICD-10-CM | POA: Diagnosis not present

## 2015-01-27 DIAGNOSIS — R197 Diarrhea, unspecified: Secondary | ICD-10-CM | POA: Diagnosis not present

## 2015-01-27 DIAGNOSIS — R4182 Altered mental status, unspecified: Secondary | ICD-10-CM | POA: Diagnosis not present

## 2015-01-27 DIAGNOSIS — F17210 Nicotine dependence, cigarettes, uncomplicated: Secondary | ICD-10-CM | POA: Diagnosis not present

## 2015-01-27 DIAGNOSIS — K219 Gastro-esophageal reflux disease without esophagitis: Secondary | ICD-10-CM | POA: Diagnosis not present

## 2015-01-27 DIAGNOSIS — E871 Hypo-osmolality and hyponatremia: Secondary | ICD-10-CM | POA: Diagnosis not present

## 2015-01-27 DIAGNOSIS — R109 Unspecified abdominal pain: Secondary | ICD-10-CM | POA: Diagnosis not present

## 2015-01-27 DIAGNOSIS — I10 Essential (primary) hypertension: Secondary | ICD-10-CM | POA: Diagnosis not present

## 2015-01-27 DIAGNOSIS — Z79899 Other long term (current) drug therapy: Secondary | ICD-10-CM | POA: Diagnosis not present

## 2015-01-27 DIAGNOSIS — Z8042 Family history of malignant neoplasm of prostate: Secondary | ICD-10-CM | POA: Diagnosis not present

## 2015-01-27 DIAGNOSIS — R531 Weakness: Secondary | ICD-10-CM | POA: Diagnosis not present

## 2015-01-27 DIAGNOSIS — R7989 Other specified abnormal findings of blood chemistry: Secondary | ICD-10-CM | POA: Diagnosis not present

## 2015-01-27 DIAGNOSIS — Z1159 Encounter for screening for other viral diseases: Secondary | ICD-10-CM | POA: Diagnosis not present

## 2015-01-27 DIAGNOSIS — C719 Malignant neoplasm of brain, unspecified: Secondary | ICD-10-CM | POA: Diagnosis not present

## 2015-01-30 DIAGNOSIS — R011 Cardiac murmur, unspecified: Secondary | ICD-10-CM | POA: Diagnosis not present

## 2015-01-30 DIAGNOSIS — R197 Diarrhea, unspecified: Secondary | ICD-10-CM | POA: Diagnosis not present

## 2015-01-30 DIAGNOSIS — R140 Abdominal distension (gaseous): Secondary | ICD-10-CM | POA: Diagnosis not present

## 2015-01-30 DIAGNOSIS — F17210 Nicotine dependence, cigarettes, uncomplicated: Secondary | ICD-10-CM | POA: Diagnosis not present

## 2015-01-30 DIAGNOSIS — Z1159 Encounter for screening for other viral diseases: Secondary | ICD-10-CM | POA: Diagnosis not present

## 2015-01-30 DIAGNOSIS — R531 Weakness: Secondary | ICD-10-CM | POA: Diagnosis not present

## 2015-01-30 DIAGNOSIS — E860 Dehydration: Secondary | ICD-10-CM | POA: Diagnosis not present

## 2015-01-30 DIAGNOSIS — E780 Pure hypercholesterolemia: Secondary | ICD-10-CM | POA: Diagnosis not present

## 2015-01-30 DIAGNOSIS — I252 Old myocardial infarction: Secondary | ICD-10-CM | POA: Diagnosis not present

## 2015-01-30 DIAGNOSIS — E871 Hypo-osmolality and hyponatremia: Secondary | ICD-10-CM | POA: Diagnosis not present

## 2015-01-30 DIAGNOSIS — N390 Urinary tract infection, site not specified: Secondary | ICD-10-CM | POA: Diagnosis not present

## 2015-01-30 DIAGNOSIS — R7989 Other specified abnormal findings of blood chemistry: Secondary | ICD-10-CM | POA: Diagnosis not present

## 2015-01-30 DIAGNOSIS — C719 Malignant neoplasm of brain, unspecified: Secondary | ICD-10-CM | POA: Diagnosis not present

## 2015-01-30 DIAGNOSIS — Z801 Family history of malignant neoplasm of trachea, bronchus and lung: Secondary | ICD-10-CM | POA: Diagnosis not present

## 2015-01-30 DIAGNOSIS — R4182 Altered mental status, unspecified: Secondary | ICD-10-CM | POA: Diagnosis not present

## 2015-01-30 DIAGNOSIS — Z8042 Family history of malignant neoplasm of prostate: Secondary | ICD-10-CM | POA: Diagnosis not present

## 2015-01-30 DIAGNOSIS — Z8711 Personal history of peptic ulcer disease: Secondary | ICD-10-CM | POA: Diagnosis not present

## 2015-01-30 DIAGNOSIS — R109 Unspecified abdominal pain: Secondary | ICD-10-CM | POA: Diagnosis not present

## 2015-01-30 DIAGNOSIS — K219 Gastro-esophageal reflux disease without esophagitis: Secondary | ICD-10-CM | POA: Diagnosis not present

## 2015-01-30 DIAGNOSIS — Z79899 Other long term (current) drug therapy: Secondary | ICD-10-CM | POA: Diagnosis not present

## 2015-01-30 DIAGNOSIS — I10 Essential (primary) hypertension: Secondary | ICD-10-CM | POA: Diagnosis not present

## 2015-01-31 DIAGNOSIS — F17210 Nicotine dependence, cigarettes, uncomplicated: Secondary | ICD-10-CM | POA: Diagnosis not present

## 2015-01-31 DIAGNOSIS — Z8711 Personal history of peptic ulcer disease: Secondary | ICD-10-CM | POA: Diagnosis not present

## 2015-01-31 DIAGNOSIS — Z79899 Other long term (current) drug therapy: Secondary | ICD-10-CM | POA: Diagnosis not present

## 2015-01-31 DIAGNOSIS — R109 Unspecified abdominal pain: Secondary | ICD-10-CM | POA: Diagnosis not present

## 2015-01-31 DIAGNOSIS — K219 Gastro-esophageal reflux disease without esophagitis: Secondary | ICD-10-CM | POA: Diagnosis not present

## 2015-01-31 DIAGNOSIS — R531 Weakness: Secondary | ICD-10-CM | POA: Diagnosis not present

## 2015-01-31 DIAGNOSIS — N390 Urinary tract infection, site not specified: Secondary | ICD-10-CM | POA: Diagnosis not present

## 2015-01-31 DIAGNOSIS — I252 Old myocardial infarction: Secondary | ICD-10-CM | POA: Diagnosis not present

## 2015-01-31 DIAGNOSIS — C719 Malignant neoplasm of brain, unspecified: Secondary | ICD-10-CM | POA: Diagnosis not present

## 2015-01-31 DIAGNOSIS — Z8042 Family history of malignant neoplasm of prostate: Secondary | ICD-10-CM | POA: Diagnosis not present

## 2015-01-31 DIAGNOSIS — R7989 Other specified abnormal findings of blood chemistry: Secondary | ICD-10-CM | POA: Diagnosis not present

## 2015-01-31 DIAGNOSIS — R197 Diarrhea, unspecified: Secondary | ICD-10-CM | POA: Diagnosis not present

## 2015-01-31 DIAGNOSIS — E860 Dehydration: Secondary | ICD-10-CM | POA: Diagnosis not present

## 2015-01-31 DIAGNOSIS — R140 Abdominal distension (gaseous): Secondary | ICD-10-CM | POA: Diagnosis not present

## 2015-01-31 DIAGNOSIS — E871 Hypo-osmolality and hyponatremia: Secondary | ICD-10-CM | POA: Diagnosis not present

## 2015-01-31 DIAGNOSIS — E780 Pure hypercholesterolemia: Secondary | ICD-10-CM | POA: Diagnosis not present

## 2015-01-31 DIAGNOSIS — R4182 Altered mental status, unspecified: Secondary | ICD-10-CM | POA: Diagnosis not present

## 2015-01-31 DIAGNOSIS — Z1159 Encounter for screening for other viral diseases: Secondary | ICD-10-CM | POA: Diagnosis not present

## 2015-01-31 DIAGNOSIS — R011 Cardiac murmur, unspecified: Secondary | ICD-10-CM | POA: Diagnosis not present

## 2015-01-31 DIAGNOSIS — Z801 Family history of malignant neoplasm of trachea, bronchus and lung: Secondary | ICD-10-CM | POA: Diagnosis not present

## 2015-01-31 DIAGNOSIS — I10 Essential (primary) hypertension: Secondary | ICD-10-CM | POA: Diagnosis not present

## 2015-02-01 ENCOUNTER — Ambulatory Visit: Payer: Self-pay | Admitting: Internal Medicine

## 2015-02-01 DIAGNOSIS — R531 Weakness: Secondary | ICD-10-CM | POA: Diagnosis not present

## 2015-02-01 DIAGNOSIS — R197 Diarrhea, unspecified: Secondary | ICD-10-CM | POA: Diagnosis not present

## 2015-02-01 DIAGNOSIS — R7989 Other specified abnormal findings of blood chemistry: Secondary | ICD-10-CM | POA: Diagnosis not present

## 2015-02-01 DIAGNOSIS — E784 Other hyperlipidemia: Secondary | ICD-10-CM | POA: Diagnosis not present

## 2015-02-01 DIAGNOSIS — E780 Pure hypercholesterolemia: Secondary | ICD-10-CM | POA: Diagnosis not present

## 2015-02-01 DIAGNOSIS — Z1159 Encounter for screening for other viral diseases: Secondary | ICD-10-CM | POA: Diagnosis not present

## 2015-02-01 DIAGNOSIS — Z79899 Other long term (current) drug therapy: Secondary | ICD-10-CM | POA: Diagnosis not present

## 2015-02-01 DIAGNOSIS — K277 Chronic peptic ulcer, site unspecified, without hemorrhage or perforation: Secondary | ICD-10-CM | POA: Diagnosis not present

## 2015-02-01 DIAGNOSIS — E860 Dehydration: Secondary | ICD-10-CM | POA: Diagnosis not present

## 2015-02-01 DIAGNOSIS — N390 Urinary tract infection, site not specified: Secondary | ICD-10-CM | POA: Diagnosis not present

## 2015-02-01 DIAGNOSIS — I252 Old myocardial infarction: Secondary | ICD-10-CM | POA: Diagnosis not present

## 2015-02-01 DIAGNOSIS — Z8711 Personal history of peptic ulcer disease: Secondary | ICD-10-CM | POA: Diagnosis not present

## 2015-02-01 DIAGNOSIS — R011 Cardiac murmur, unspecified: Secondary | ICD-10-CM | POA: Diagnosis not present

## 2015-02-01 DIAGNOSIS — J439 Emphysema, unspecified: Secondary | ICD-10-CM | POA: Diagnosis not present

## 2015-02-01 DIAGNOSIS — Z8042 Family history of malignant neoplasm of prostate: Secondary | ICD-10-CM | POA: Diagnosis not present

## 2015-02-01 DIAGNOSIS — E871 Hypo-osmolality and hyponatremia: Secondary | ICD-10-CM | POA: Diagnosis not present

## 2015-02-01 DIAGNOSIS — I10 Essential (primary) hypertension: Secondary | ICD-10-CM | POA: Diagnosis not present

## 2015-02-01 DIAGNOSIS — R4182 Altered mental status, unspecified: Secondary | ICD-10-CM | POA: Diagnosis not present

## 2015-02-01 DIAGNOSIS — Z801 Family history of malignant neoplasm of trachea, bronchus and lung: Secondary | ICD-10-CM | POA: Diagnosis not present

## 2015-02-01 DIAGNOSIS — F17210 Nicotine dependence, cigarettes, uncomplicated: Secondary | ICD-10-CM | POA: Diagnosis not present

## 2015-02-01 DIAGNOSIS — R109 Unspecified abdominal pain: Secondary | ICD-10-CM | POA: Diagnosis not present

## 2015-02-01 DIAGNOSIS — C719 Malignant neoplasm of brain, unspecified: Secondary | ICD-10-CM | POA: Diagnosis not present

## 2015-02-01 DIAGNOSIS — R140 Abdominal distension (gaseous): Secondary | ICD-10-CM | POA: Diagnosis not present

## 2015-02-01 DIAGNOSIS — K219 Gastro-esophageal reflux disease without esophagitis: Secondary | ICD-10-CM | POA: Diagnosis not present

## 2015-02-02 DIAGNOSIS — K219 Gastro-esophageal reflux disease without esophagitis: Secondary | ICD-10-CM | POA: Diagnosis not present

## 2015-02-02 DIAGNOSIS — Z801 Family history of malignant neoplasm of trachea, bronchus and lung: Secondary | ICD-10-CM | POA: Diagnosis not present

## 2015-02-02 DIAGNOSIS — E780 Pure hypercholesterolemia: Secondary | ICD-10-CM | POA: Diagnosis not present

## 2015-02-02 DIAGNOSIS — I252 Old myocardial infarction: Secondary | ICD-10-CM | POA: Diagnosis not present

## 2015-02-02 DIAGNOSIS — R7989 Other specified abnormal findings of blood chemistry: Secondary | ICD-10-CM | POA: Diagnosis not present

## 2015-02-02 DIAGNOSIS — Z8042 Family history of malignant neoplasm of prostate: Secondary | ICD-10-CM | POA: Diagnosis not present

## 2015-02-02 DIAGNOSIS — Z79899 Other long term (current) drug therapy: Secondary | ICD-10-CM | POA: Diagnosis not present

## 2015-02-02 DIAGNOSIS — E860 Dehydration: Secondary | ICD-10-CM | POA: Diagnosis not present

## 2015-02-02 DIAGNOSIS — R4182 Altered mental status, unspecified: Secondary | ICD-10-CM | POA: Diagnosis not present

## 2015-02-02 DIAGNOSIS — R011 Cardiac murmur, unspecified: Secondary | ICD-10-CM | POA: Diagnosis not present

## 2015-02-02 DIAGNOSIS — R109 Unspecified abdominal pain: Secondary | ICD-10-CM | POA: Diagnosis not present

## 2015-02-02 DIAGNOSIS — Z8711 Personal history of peptic ulcer disease: Secondary | ICD-10-CM | POA: Diagnosis not present

## 2015-02-02 DIAGNOSIS — E871 Hypo-osmolality and hyponatremia: Secondary | ICD-10-CM | POA: Diagnosis not present

## 2015-02-02 DIAGNOSIS — C719 Malignant neoplasm of brain, unspecified: Secondary | ICD-10-CM | POA: Diagnosis not present

## 2015-02-02 DIAGNOSIS — R531 Weakness: Secondary | ICD-10-CM | POA: Diagnosis not present

## 2015-02-02 DIAGNOSIS — R197 Diarrhea, unspecified: Secondary | ICD-10-CM | POA: Diagnosis not present

## 2015-02-02 DIAGNOSIS — I10 Essential (primary) hypertension: Secondary | ICD-10-CM | POA: Diagnosis not present

## 2015-02-02 DIAGNOSIS — Z1159 Encounter for screening for other viral diseases: Secondary | ICD-10-CM | POA: Diagnosis not present

## 2015-02-02 DIAGNOSIS — R140 Abdominal distension (gaseous): Secondary | ICD-10-CM | POA: Diagnosis not present

## 2015-02-02 DIAGNOSIS — N390 Urinary tract infection, site not specified: Secondary | ICD-10-CM | POA: Diagnosis not present

## 2015-02-02 DIAGNOSIS — F17210 Nicotine dependence, cigarettes, uncomplicated: Secondary | ICD-10-CM | POA: Diagnosis not present

## 2015-02-03 DIAGNOSIS — E780 Pure hypercholesterolemia: Secondary | ICD-10-CM | POA: Diagnosis not present

## 2015-02-03 DIAGNOSIS — N390 Urinary tract infection, site not specified: Secondary | ICD-10-CM | POA: Diagnosis not present

## 2015-02-03 DIAGNOSIS — I10 Essential (primary) hypertension: Secondary | ICD-10-CM | POA: Diagnosis not present

## 2015-02-03 DIAGNOSIS — R531 Weakness: Secondary | ICD-10-CM | POA: Diagnosis not present

## 2015-02-03 DIAGNOSIS — E871 Hypo-osmolality and hyponatremia: Secondary | ICD-10-CM | POA: Diagnosis not present

## 2015-02-03 DIAGNOSIS — Z801 Family history of malignant neoplasm of trachea, bronchus and lung: Secondary | ICD-10-CM | POA: Diagnosis not present

## 2015-02-03 DIAGNOSIS — R4182 Altered mental status, unspecified: Secondary | ICD-10-CM | POA: Diagnosis not present

## 2015-02-03 DIAGNOSIS — E860 Dehydration: Secondary | ICD-10-CM | POA: Diagnosis not present

## 2015-02-03 DIAGNOSIS — Z8042 Family history of malignant neoplasm of prostate: Secondary | ICD-10-CM | POA: Diagnosis not present

## 2015-02-03 DIAGNOSIS — I252 Old myocardial infarction: Secondary | ICD-10-CM | POA: Diagnosis not present

## 2015-02-03 DIAGNOSIS — R011 Cardiac murmur, unspecified: Secondary | ICD-10-CM | POA: Diagnosis not present

## 2015-02-03 DIAGNOSIS — C719 Malignant neoplasm of brain, unspecified: Secondary | ICD-10-CM | POA: Diagnosis not present

## 2015-02-03 DIAGNOSIS — Z8711 Personal history of peptic ulcer disease: Secondary | ICD-10-CM | POA: Diagnosis not present

## 2015-02-03 DIAGNOSIS — Z1159 Encounter for screening for other viral diseases: Secondary | ICD-10-CM | POA: Diagnosis not present

## 2015-02-03 DIAGNOSIS — Z79899 Other long term (current) drug therapy: Secondary | ICD-10-CM | POA: Diagnosis not present

## 2015-02-03 DIAGNOSIS — F17210 Nicotine dependence, cigarettes, uncomplicated: Secondary | ICD-10-CM | POA: Diagnosis not present

## 2015-02-03 DIAGNOSIS — R7989 Other specified abnormal findings of blood chemistry: Secondary | ICD-10-CM | POA: Diagnosis not present

## 2015-02-03 DIAGNOSIS — K219 Gastro-esophageal reflux disease without esophagitis: Secondary | ICD-10-CM | POA: Diagnosis not present

## 2015-02-03 DIAGNOSIS — R140 Abdominal distension (gaseous): Secondary | ICD-10-CM | POA: Diagnosis not present

## 2015-02-03 DIAGNOSIS — R197 Diarrhea, unspecified: Secondary | ICD-10-CM | POA: Diagnosis not present

## 2015-02-03 DIAGNOSIS — R109 Unspecified abdominal pain: Secondary | ICD-10-CM | POA: Diagnosis not present

## 2015-02-04 DIAGNOSIS — R112 Nausea with vomiting, unspecified: Secondary | ICD-10-CM | POA: Diagnosis not present

## 2015-02-06 DIAGNOSIS — N390 Urinary tract infection, site not specified: Secondary | ICD-10-CM | POA: Diagnosis not present

## 2015-02-06 DIAGNOSIS — I252 Old myocardial infarction: Secondary | ICD-10-CM | POA: Diagnosis not present

## 2015-02-06 DIAGNOSIS — E780 Pure hypercholesterolemia: Secondary | ICD-10-CM | POA: Diagnosis not present

## 2015-02-06 DIAGNOSIS — R011 Cardiac murmur, unspecified: Secondary | ICD-10-CM | POA: Diagnosis not present

## 2015-02-06 DIAGNOSIS — Z801 Family history of malignant neoplasm of trachea, bronchus and lung: Secondary | ICD-10-CM | POA: Diagnosis not present

## 2015-02-06 DIAGNOSIS — Z79899 Other long term (current) drug therapy: Secondary | ICD-10-CM | POA: Diagnosis not present

## 2015-02-06 DIAGNOSIS — R109 Unspecified abdominal pain: Secondary | ICD-10-CM | POA: Diagnosis not present

## 2015-02-06 DIAGNOSIS — F17210 Nicotine dependence, cigarettes, uncomplicated: Secondary | ICD-10-CM | POA: Diagnosis not present

## 2015-02-06 DIAGNOSIS — R7989 Other specified abnormal findings of blood chemistry: Secondary | ICD-10-CM | POA: Diagnosis not present

## 2015-02-06 DIAGNOSIS — I10 Essential (primary) hypertension: Secondary | ICD-10-CM | POA: Diagnosis not present

## 2015-02-06 DIAGNOSIS — Z1159 Encounter for screening for other viral diseases: Secondary | ICD-10-CM | POA: Diagnosis not present

## 2015-02-06 DIAGNOSIS — R531 Weakness: Secondary | ICD-10-CM | POA: Diagnosis not present

## 2015-02-06 DIAGNOSIS — R140 Abdominal distension (gaseous): Secondary | ICD-10-CM | POA: Diagnosis not present

## 2015-02-06 DIAGNOSIS — K219 Gastro-esophageal reflux disease without esophagitis: Secondary | ICD-10-CM | POA: Diagnosis not present

## 2015-02-06 DIAGNOSIS — E871 Hypo-osmolality and hyponatremia: Secondary | ICD-10-CM | POA: Diagnosis not present

## 2015-02-06 DIAGNOSIS — R4182 Altered mental status, unspecified: Secondary | ICD-10-CM | POA: Diagnosis not present

## 2015-02-06 DIAGNOSIS — Z8711 Personal history of peptic ulcer disease: Secondary | ICD-10-CM | POA: Diagnosis not present

## 2015-02-06 DIAGNOSIS — R197 Diarrhea, unspecified: Secondary | ICD-10-CM | POA: Diagnosis not present

## 2015-02-06 DIAGNOSIS — Z8042 Family history of malignant neoplasm of prostate: Secondary | ICD-10-CM | POA: Diagnosis not present

## 2015-02-06 DIAGNOSIS — C719 Malignant neoplasm of brain, unspecified: Secondary | ICD-10-CM | POA: Diagnosis not present

## 2015-02-06 DIAGNOSIS — E860 Dehydration: Secondary | ICD-10-CM | POA: Diagnosis not present

## 2015-02-07 DIAGNOSIS — Z79899 Other long term (current) drug therapy: Secondary | ICD-10-CM | POA: Diagnosis not present

## 2015-02-07 DIAGNOSIS — R011 Cardiac murmur, unspecified: Secondary | ICD-10-CM | POA: Diagnosis not present

## 2015-02-07 DIAGNOSIS — E871 Hypo-osmolality and hyponatremia: Secondary | ICD-10-CM | POA: Diagnosis not present

## 2015-02-07 DIAGNOSIS — N390 Urinary tract infection, site not specified: Secondary | ICD-10-CM | POA: Diagnosis not present

## 2015-02-07 DIAGNOSIS — F17210 Nicotine dependence, cigarettes, uncomplicated: Secondary | ICD-10-CM | POA: Diagnosis not present

## 2015-02-07 DIAGNOSIS — R140 Abdominal distension (gaseous): Secondary | ICD-10-CM | POA: Diagnosis not present

## 2015-02-07 DIAGNOSIS — E860 Dehydration: Secondary | ICD-10-CM | POA: Diagnosis not present

## 2015-02-07 DIAGNOSIS — K219 Gastro-esophageal reflux disease without esophagitis: Secondary | ICD-10-CM | POA: Diagnosis not present

## 2015-02-07 DIAGNOSIS — C719 Malignant neoplasm of brain, unspecified: Secondary | ICD-10-CM | POA: Diagnosis not present

## 2015-02-07 DIAGNOSIS — I10 Essential (primary) hypertension: Secondary | ICD-10-CM | POA: Diagnosis not present

## 2015-02-07 DIAGNOSIS — R531 Weakness: Secondary | ICD-10-CM | POA: Diagnosis not present

## 2015-02-07 DIAGNOSIS — R109 Unspecified abdominal pain: Secondary | ICD-10-CM | POA: Diagnosis not present

## 2015-02-07 DIAGNOSIS — E780 Pure hypercholesterolemia: Secondary | ICD-10-CM | POA: Diagnosis not present

## 2015-02-07 DIAGNOSIS — Z1159 Encounter for screening for other viral diseases: Secondary | ICD-10-CM | POA: Diagnosis not present

## 2015-02-07 DIAGNOSIS — Z8711 Personal history of peptic ulcer disease: Secondary | ICD-10-CM | POA: Diagnosis not present

## 2015-02-07 DIAGNOSIS — I252 Old myocardial infarction: Secondary | ICD-10-CM | POA: Diagnosis not present

## 2015-02-07 DIAGNOSIS — Z801 Family history of malignant neoplasm of trachea, bronchus and lung: Secondary | ICD-10-CM | POA: Diagnosis not present

## 2015-02-07 DIAGNOSIS — Z8042 Family history of malignant neoplasm of prostate: Secondary | ICD-10-CM | POA: Diagnosis not present

## 2015-02-07 DIAGNOSIS — R4182 Altered mental status, unspecified: Secondary | ICD-10-CM | POA: Diagnosis not present

## 2015-02-07 DIAGNOSIS — R7989 Other specified abnormal findings of blood chemistry: Secondary | ICD-10-CM | POA: Diagnosis not present

## 2015-02-07 DIAGNOSIS — R197 Diarrhea, unspecified: Secondary | ICD-10-CM | POA: Diagnosis not present

## 2015-02-08 DIAGNOSIS — E860 Dehydration: Secondary | ICD-10-CM | POA: Diagnosis not present

## 2015-02-08 DIAGNOSIS — Z79899 Other long term (current) drug therapy: Secondary | ICD-10-CM | POA: Diagnosis not present

## 2015-02-08 DIAGNOSIS — R7989 Other specified abnormal findings of blood chemistry: Secondary | ICD-10-CM | POA: Diagnosis not present

## 2015-02-08 DIAGNOSIS — R197 Diarrhea, unspecified: Secondary | ICD-10-CM | POA: Diagnosis not present

## 2015-02-08 DIAGNOSIS — R109 Unspecified abdominal pain: Secondary | ICD-10-CM | POA: Diagnosis not present

## 2015-02-08 DIAGNOSIS — R4182 Altered mental status, unspecified: Secondary | ICD-10-CM | POA: Diagnosis not present

## 2015-02-08 DIAGNOSIS — R531 Weakness: Secondary | ICD-10-CM | POA: Diagnosis not present

## 2015-02-08 DIAGNOSIS — Z8042 Family history of malignant neoplasm of prostate: Secondary | ICD-10-CM | POA: Diagnosis not present

## 2015-02-08 DIAGNOSIS — N390 Urinary tract infection, site not specified: Secondary | ICD-10-CM | POA: Diagnosis not present

## 2015-02-08 DIAGNOSIS — R42 Dizziness and giddiness: Secondary | ICD-10-CM | POA: Diagnosis not present

## 2015-02-08 DIAGNOSIS — K219 Gastro-esophageal reflux disease without esophagitis: Secondary | ICD-10-CM | POA: Diagnosis not present

## 2015-02-08 DIAGNOSIS — F17210 Nicotine dependence, cigarettes, uncomplicated: Secondary | ICD-10-CM | POA: Diagnosis not present

## 2015-02-08 DIAGNOSIS — R011 Cardiac murmur, unspecified: Secondary | ICD-10-CM | POA: Diagnosis not present

## 2015-02-08 DIAGNOSIS — C719 Malignant neoplasm of brain, unspecified: Secondary | ICD-10-CM | POA: Diagnosis not present

## 2015-02-08 DIAGNOSIS — E780 Pure hypercholesterolemia: Secondary | ICD-10-CM | POA: Diagnosis not present

## 2015-02-08 DIAGNOSIS — I10 Essential (primary) hypertension: Secondary | ICD-10-CM | POA: Diagnosis not present

## 2015-02-08 DIAGNOSIS — Z801 Family history of malignant neoplasm of trachea, bronchus and lung: Secondary | ICD-10-CM | POA: Diagnosis not present

## 2015-02-08 DIAGNOSIS — Z8711 Personal history of peptic ulcer disease: Secondary | ICD-10-CM | POA: Diagnosis not present

## 2015-02-08 DIAGNOSIS — Z1159 Encounter for screening for other viral diseases: Secondary | ICD-10-CM | POA: Diagnosis not present

## 2015-02-08 DIAGNOSIS — I252 Old myocardial infarction: Secondary | ICD-10-CM | POA: Diagnosis not present

## 2015-02-08 DIAGNOSIS — E871 Hypo-osmolality and hyponatremia: Secondary | ICD-10-CM | POA: Diagnosis not present

## 2015-02-08 DIAGNOSIS — R140 Abdominal distension (gaseous): Secondary | ICD-10-CM | POA: Diagnosis not present

## 2015-02-09 DIAGNOSIS — R140 Abdominal distension (gaseous): Secondary | ICD-10-CM | POA: Diagnosis not present

## 2015-02-09 DIAGNOSIS — R7989 Other specified abnormal findings of blood chemistry: Secondary | ICD-10-CM | POA: Diagnosis not present

## 2015-02-09 DIAGNOSIS — F17210 Nicotine dependence, cigarettes, uncomplicated: Secondary | ICD-10-CM | POA: Diagnosis not present

## 2015-02-09 DIAGNOSIS — R531 Weakness: Secondary | ICD-10-CM | POA: Diagnosis not present

## 2015-02-09 DIAGNOSIS — Z8711 Personal history of peptic ulcer disease: Secondary | ICD-10-CM | POA: Diagnosis not present

## 2015-02-09 DIAGNOSIS — I10 Essential (primary) hypertension: Secondary | ICD-10-CM | POA: Diagnosis not present

## 2015-02-09 DIAGNOSIS — Z8042 Family history of malignant neoplasm of prostate: Secondary | ICD-10-CM | POA: Diagnosis not present

## 2015-02-09 DIAGNOSIS — Z801 Family history of malignant neoplasm of trachea, bronchus and lung: Secondary | ICD-10-CM | POA: Diagnosis not present

## 2015-02-09 DIAGNOSIS — R109 Unspecified abdominal pain: Secondary | ICD-10-CM | POA: Diagnosis not present

## 2015-02-09 DIAGNOSIS — R4182 Altered mental status, unspecified: Secondary | ICD-10-CM | POA: Diagnosis not present

## 2015-02-09 DIAGNOSIS — R011 Cardiac murmur, unspecified: Secondary | ICD-10-CM | POA: Diagnosis not present

## 2015-02-09 DIAGNOSIS — E780 Pure hypercholesterolemia: Secondary | ICD-10-CM | POA: Diagnosis not present

## 2015-02-09 DIAGNOSIS — R197 Diarrhea, unspecified: Secondary | ICD-10-CM | POA: Diagnosis not present

## 2015-02-09 DIAGNOSIS — E860 Dehydration: Secondary | ICD-10-CM | POA: Diagnosis not present

## 2015-02-09 DIAGNOSIS — I252 Old myocardial infarction: Secondary | ICD-10-CM | POA: Diagnosis not present

## 2015-02-09 DIAGNOSIS — N390 Urinary tract infection, site not specified: Secondary | ICD-10-CM | POA: Diagnosis not present

## 2015-02-09 DIAGNOSIS — Z79899 Other long term (current) drug therapy: Secondary | ICD-10-CM | POA: Diagnosis not present

## 2015-02-09 DIAGNOSIS — Z1159 Encounter for screening for other viral diseases: Secondary | ICD-10-CM | POA: Diagnosis not present

## 2015-02-09 DIAGNOSIS — C719 Malignant neoplasm of brain, unspecified: Secondary | ICD-10-CM | POA: Diagnosis not present

## 2015-02-09 DIAGNOSIS — K219 Gastro-esophageal reflux disease without esophagitis: Secondary | ICD-10-CM | POA: Diagnosis not present

## 2015-02-09 DIAGNOSIS — E871 Hypo-osmolality and hyponatremia: Secondary | ICD-10-CM | POA: Diagnosis not present

## 2015-02-09 LAB — CBC CANCER CENTER
Basophil #: 0.1 x10 3/mm (ref 0.0–0.1)
Basophil %: 1 %
EOS ABS: 0.1 x10 3/mm (ref 0.0–0.7)
Eosinophil %: 1.2 %
HCT: 39.5 % — ABNORMAL LOW (ref 40.0–52.0)
HGB: 13.2 g/dL (ref 13.0–18.0)
LYMPHS ABS: 2.1 x10 3/mm (ref 1.0–3.6)
LYMPHS PCT: 20 %
MCH: 29.5 pg (ref 26.0–34.0)
MCHC: 33.5 g/dL (ref 32.0–36.0)
MCV: 88 fL (ref 80–100)
MONOS PCT: 5.8 %
Monocyte #: 0.6 x10 3/mm (ref 0.2–1.0)
Neutrophil #: 7.6 x10 3/mm — ABNORMAL HIGH (ref 1.4–6.5)
Neutrophil %: 72 %
PLATELETS: 240 x10 3/mm (ref 150–440)
RBC: 4.49 10*6/uL (ref 4.40–5.90)
RDW: 17.5 % — AB (ref 11.5–14.5)
WBC: 10.5 x10 3/mm (ref 3.8–10.6)

## 2015-02-10 DIAGNOSIS — K219 Gastro-esophageal reflux disease without esophagitis: Secondary | ICD-10-CM | POA: Diagnosis not present

## 2015-02-10 DIAGNOSIS — R011 Cardiac murmur, unspecified: Secondary | ICD-10-CM | POA: Diagnosis not present

## 2015-02-10 DIAGNOSIS — R7989 Other specified abnormal findings of blood chemistry: Secondary | ICD-10-CM | POA: Diagnosis not present

## 2015-02-10 DIAGNOSIS — C719 Malignant neoplasm of brain, unspecified: Secondary | ICD-10-CM | POA: Diagnosis not present

## 2015-02-10 DIAGNOSIS — Z801 Family history of malignant neoplasm of trachea, bronchus and lung: Secondary | ICD-10-CM | POA: Diagnosis not present

## 2015-02-10 DIAGNOSIS — E871 Hypo-osmolality and hyponatremia: Secondary | ICD-10-CM | POA: Diagnosis not present

## 2015-02-10 DIAGNOSIS — E780 Pure hypercholesterolemia: Secondary | ICD-10-CM | POA: Diagnosis not present

## 2015-02-10 DIAGNOSIS — R140 Abdominal distension (gaseous): Secondary | ICD-10-CM | POA: Diagnosis not present

## 2015-02-10 DIAGNOSIS — R109 Unspecified abdominal pain: Secondary | ICD-10-CM | POA: Diagnosis not present

## 2015-02-10 DIAGNOSIS — Z8042 Family history of malignant neoplasm of prostate: Secondary | ICD-10-CM | POA: Diagnosis not present

## 2015-02-10 DIAGNOSIS — E860 Dehydration: Secondary | ICD-10-CM | POA: Diagnosis not present

## 2015-02-10 DIAGNOSIS — R4182 Altered mental status, unspecified: Secondary | ICD-10-CM | POA: Diagnosis not present

## 2015-02-10 DIAGNOSIS — R531 Weakness: Secondary | ICD-10-CM | POA: Diagnosis not present

## 2015-02-10 DIAGNOSIS — Z79899 Other long term (current) drug therapy: Secondary | ICD-10-CM | POA: Diagnosis not present

## 2015-02-10 DIAGNOSIS — Z1159 Encounter for screening for other viral diseases: Secondary | ICD-10-CM | POA: Diagnosis not present

## 2015-02-10 DIAGNOSIS — I252 Old myocardial infarction: Secondary | ICD-10-CM | POA: Diagnosis not present

## 2015-02-10 DIAGNOSIS — N390 Urinary tract infection, site not specified: Secondary | ICD-10-CM | POA: Diagnosis not present

## 2015-02-10 DIAGNOSIS — Z8711 Personal history of peptic ulcer disease: Secondary | ICD-10-CM | POA: Diagnosis not present

## 2015-02-10 DIAGNOSIS — R197 Diarrhea, unspecified: Secondary | ICD-10-CM | POA: Diagnosis not present

## 2015-02-10 DIAGNOSIS — F17210 Nicotine dependence, cigarettes, uncomplicated: Secondary | ICD-10-CM | POA: Diagnosis not present

## 2015-02-10 DIAGNOSIS — I10 Essential (primary) hypertension: Secondary | ICD-10-CM | POA: Diagnosis not present

## 2015-02-11 DIAGNOSIS — R739 Hyperglycemia, unspecified: Secondary | ICD-10-CM | POA: Diagnosis not present

## 2015-02-13 ENCOUNTER — Emergency Department: Payer: Self-pay | Admitting: Emergency Medicine

## 2015-02-13 DIAGNOSIS — C719 Malignant neoplasm of brain, unspecified: Secondary | ICD-10-CM | POA: Diagnosis not present

## 2015-02-13 DIAGNOSIS — R410 Disorientation, unspecified: Secondary | ICD-10-CM | POA: Diagnosis not present

## 2015-02-13 DIAGNOSIS — G936 Cerebral edema: Secondary | ICD-10-CM | POA: Diagnosis not present

## 2015-02-13 DIAGNOSIS — N390 Urinary tract infection, site not specified: Secondary | ICD-10-CM | POA: Diagnosis not present

## 2015-02-13 DIAGNOSIS — R4182 Altered mental status, unspecified: Secondary | ICD-10-CM | POA: Diagnosis not present

## 2015-02-13 LAB — URINALYSIS, COMPLETE
BACTERIA: NONE SEEN
BILIRUBIN, UR: NEGATIVE
Blood: NEGATIVE
Glucose,UR: NEGATIVE mg/dL (ref 0–75)
Hyaline Cast: 2
KETONE: NEGATIVE
Nitrite: NEGATIVE
PROTEIN: NEGATIVE
Ph: 5 (ref 4.5–8.0)
RBC,UR: 1 /HPF (ref 0–5)
Specific Gravity: 1.011 (ref 1.003–1.030)
Squamous Epithelial: NONE SEEN

## 2015-02-13 LAB — CBC WITH DIFFERENTIAL/PLATELET
BASOS ABS: 0.1 10*3/uL (ref 0.0–0.1)
Basophil %: 0.5 %
EOS PCT: 1.1 %
Eosinophil #: 0.1 10*3/uL (ref 0.0–0.7)
HCT: 39.8 % — ABNORMAL LOW (ref 40.0–52.0)
HGB: 13.3 g/dL (ref 13.0–18.0)
LYMPHS ABS: 1.2 10*3/uL (ref 1.0–3.6)
LYMPHS PCT: 11.8 %
MCH: 29.6 pg (ref 26.0–34.0)
MCHC: 33.3 g/dL (ref 32.0–36.0)
MCV: 89 fL (ref 80–100)
Monocyte #: 0.5 x10 3/mm (ref 0.2–1.0)
Monocyte %: 5.2 %
Neutrophil #: 8.6 10*3/uL — ABNORMAL HIGH (ref 1.4–6.5)
Neutrophil %: 81.4 %
PLATELETS: 253 10*3/uL (ref 150–440)
RBC: 4.48 10*6/uL (ref 4.40–5.90)
RDW: 17.9 % — AB (ref 11.5–14.5)
WBC: 10.5 10*3/uL (ref 3.8–10.6)

## 2015-02-13 LAB — COMPREHENSIVE METABOLIC PANEL
AST: 37 U/L
Albumin: 3.5 g/dL
Alkaline Phosphatase: 115 U/L
Anion Gap: 11 (ref 7–16)
BILIRUBIN TOTAL: 0.7 mg/dL
BUN: 17 mg/dL
Calcium, Total: 9.6 mg/dL
Chloride: 104 mmol/L
Co2: 27 mmol/L
Creatinine: 1.08 mg/dL
EGFR (African American): >60
Glucose: 193 mg/dL — ABNORMAL HIGH
Potassium: 4.5 mmol/L
SGPT (ALT): 100 U/L — ABNORMAL HIGH
Sodium: 142 mmol/L
Total Protein: 7.5 g/dL

## 2015-02-13 LAB — AMMONIA: Ammonia, Plasma: 9 umol/L

## 2015-02-13 LAB — TROPONIN I

## 2015-02-13 LAB — LIPASE, BLOOD: LIPASE: 48 U/L

## 2015-02-15 DIAGNOSIS — I10 Essential (primary) hypertension: Secondary | ICD-10-CM | POA: Diagnosis not present

## 2015-02-15 DIAGNOSIS — C719 Malignant neoplasm of brain, unspecified: Secondary | ICD-10-CM | POA: Diagnosis not present

## 2015-02-15 LAB — URINE CULTURE

## 2015-02-16 ENCOUNTER — Inpatient Hospital Stay
Admit: 2015-02-16 | Disposition: A | Discharge status: Skilled Nursing Facility | Payer: Self-pay | Attending: Oncology | Admitting: Oncology

## 2015-02-16 DIAGNOSIS — M549 Dorsalgia, unspecified: Secondary | ICD-10-CM | POA: Diagnosis not present

## 2015-02-16 DIAGNOSIS — Z801 Family history of malignant neoplasm of trachea, bronchus and lung: Secondary | ICD-10-CM | POA: Diagnosis not present

## 2015-02-16 DIAGNOSIS — G9341 Metabolic encephalopathy: Secondary | ICD-10-CM | POA: Diagnosis not present

## 2015-02-16 DIAGNOSIS — E780 Pure hypercholesterolemia: Secondary | ICD-10-CM | POA: Diagnosis not present

## 2015-02-16 DIAGNOSIS — E871 Hypo-osmolality and hyponatremia: Secondary | ICD-10-CM | POA: Diagnosis not present

## 2015-02-16 DIAGNOSIS — Z1159 Encounter for screening for other viral diseases: Secondary | ICD-10-CM | POA: Diagnosis not present

## 2015-02-16 DIAGNOSIS — E860 Dehydration: Secondary | ICD-10-CM | POA: Diagnosis not present

## 2015-02-16 DIAGNOSIS — R160 Hepatomegaly, not elsewhere classified: Secondary | ICD-10-CM | POA: Diagnosis not present

## 2015-02-16 DIAGNOSIS — R0602 Shortness of breath: Secondary | ICD-10-CM | POA: Diagnosis not present

## 2015-02-16 DIAGNOSIS — N179 Acute kidney failure, unspecified: Secondary | ICD-10-CM | POA: Diagnosis not present

## 2015-02-16 DIAGNOSIS — G8929 Other chronic pain: Secondary | ICD-10-CM | POA: Diagnosis not present

## 2015-02-16 DIAGNOSIS — M6281 Muscle weakness (generalized): Secondary | ICD-10-CM | POA: Diagnosis not present

## 2015-02-16 DIAGNOSIS — F17210 Nicotine dependence, cigarettes, uncomplicated: Secondary | ICD-10-CM | POA: Diagnosis not present

## 2015-02-16 DIAGNOSIS — E785 Hyperlipidemia, unspecified: Secondary | ICD-10-CM | POA: Diagnosis not present

## 2015-02-16 DIAGNOSIS — Z803 Family history of malignant neoplasm of breast: Secondary | ICD-10-CM | POA: Diagnosis not present

## 2015-02-16 DIAGNOSIS — I2510 Atherosclerotic heart disease of native coronary artery without angina pectoris: Secondary | ICD-10-CM | POA: Diagnosis not present

## 2015-02-16 DIAGNOSIS — J159 Unspecified bacterial pneumonia: Secondary | ICD-10-CM | POA: Diagnosis not present

## 2015-02-16 DIAGNOSIS — Z8711 Personal history of peptic ulcer disease: Secondary | ICD-10-CM | POA: Diagnosis not present

## 2015-02-16 DIAGNOSIS — Z79899 Other long term (current) drug therapy: Secondary | ICD-10-CM | POA: Diagnosis not present

## 2015-02-16 DIAGNOSIS — R7989 Other specified abnormal findings of blood chemistry: Secondary | ICD-10-CM | POA: Diagnosis not present

## 2015-02-16 DIAGNOSIS — R410 Disorientation, unspecified: Secondary | ICD-10-CM | POA: Diagnosis not present

## 2015-02-16 DIAGNOSIS — Z85841 Personal history of malignant neoplasm of brain: Secondary | ICD-10-CM | POA: Diagnosis not present

## 2015-02-16 DIAGNOSIS — R4182 Altered mental status, unspecified: Secondary | ICD-10-CM | POA: Diagnosis not present

## 2015-02-16 DIAGNOSIS — J189 Pneumonia, unspecified organism: Secondary | ICD-10-CM | POA: Diagnosis not present

## 2015-02-16 DIAGNOSIS — R197 Diarrhea, unspecified: Secondary | ICD-10-CM | POA: Diagnosis not present

## 2015-02-16 DIAGNOSIS — R1030 Lower abdominal pain, unspecified: Secondary | ICD-10-CM | POA: Diagnosis not present

## 2015-02-16 DIAGNOSIS — Z87891 Personal history of nicotine dependence: Secondary | ICD-10-CM | POA: Diagnosis not present

## 2015-02-16 DIAGNOSIS — Z515 Encounter for palliative care: Secondary | ICD-10-CM | POA: Diagnosis not present

## 2015-02-16 DIAGNOSIS — F419 Anxiety disorder, unspecified: Secondary | ICD-10-CM | POA: Diagnosis not present

## 2015-02-16 DIAGNOSIS — R531 Weakness: Secondary | ICD-10-CM | POA: Diagnosis not present

## 2015-02-16 DIAGNOSIS — R140 Abdominal distension (gaseous): Secondary | ICD-10-CM | POA: Diagnosis not present

## 2015-02-16 DIAGNOSIS — R110 Nausea: Secondary | ICD-10-CM | POA: Diagnosis not present

## 2015-02-16 DIAGNOSIS — C801 Malignant (primary) neoplasm, unspecified: Secondary | ICD-10-CM | POA: Diagnosis not present

## 2015-02-16 DIAGNOSIS — I252 Old myocardial infarction: Secondary | ICD-10-CM | POA: Diagnosis not present

## 2015-02-16 DIAGNOSIS — K219 Gastro-esophageal reflux disease without esophagitis: Secondary | ICD-10-CM | POA: Diagnosis not present

## 2015-02-16 DIAGNOSIS — K828 Other specified diseases of gallbladder: Secondary | ICD-10-CM | POA: Diagnosis not present

## 2015-02-16 DIAGNOSIS — R262 Difficulty in walking, not elsewhere classified: Secondary | ICD-10-CM | POA: Diagnosis not present

## 2015-02-16 DIAGNOSIS — I10 Essential (primary) hypertension: Secondary | ICD-10-CM | POA: Diagnosis not present

## 2015-02-16 DIAGNOSIS — N3289 Other specified disorders of bladder: Secondary | ICD-10-CM | POA: Diagnosis not present

## 2015-02-16 DIAGNOSIS — Z66 Do not resuscitate: Secondary | ICD-10-CM | POA: Diagnosis not present

## 2015-02-16 DIAGNOSIS — R011 Cardiac murmur, unspecified: Secondary | ICD-10-CM | POA: Diagnosis not present

## 2015-02-16 DIAGNOSIS — Z8042 Family history of malignant neoplasm of prostate: Secondary | ICD-10-CM | POA: Diagnosis not present

## 2015-02-16 DIAGNOSIS — J690 Pneumonitis due to inhalation of food and vomit: Secondary | ICD-10-CM | POA: Diagnosis not present

## 2015-02-16 DIAGNOSIS — B370 Candidal stomatitis: Secondary | ICD-10-CM | POA: Diagnosis not present

## 2015-02-16 DIAGNOSIS — K760 Fatty (change of) liver, not elsewhere classified: Secondary | ICD-10-CM | POA: Diagnosis not present

## 2015-02-16 DIAGNOSIS — N390 Urinary tract infection, site not specified: Secondary | ICD-10-CM | POA: Diagnosis not present

## 2015-02-16 DIAGNOSIS — N289 Disorder of kidney and ureter, unspecified: Secondary | ICD-10-CM | POA: Diagnosis not present

## 2015-02-16 DIAGNOSIS — B379 Candidiasis, unspecified: Secondary | ICD-10-CM | POA: Diagnosis not present

## 2015-02-16 DIAGNOSIS — K567 Ileus, unspecified: Secondary | ICD-10-CM | POA: Diagnosis not present

## 2015-02-16 DIAGNOSIS — Z5189 Encounter for other specified aftercare: Secondary | ICD-10-CM | POA: Diagnosis not present

## 2015-02-16 DIAGNOSIS — N3000 Acute cystitis without hematuria: Secondary | ICD-10-CM | POA: Diagnosis not present

## 2015-02-16 DIAGNOSIS — R109 Unspecified abdominal pain: Secondary | ICD-10-CM | POA: Diagnosis not present

## 2015-02-16 DIAGNOSIS — C719 Malignant neoplasm of brain, unspecified: Secondary | ICD-10-CM | POA: Diagnosis not present

## 2015-02-16 DIAGNOSIS — J9811 Atelectasis: Secondary | ICD-10-CM | POA: Diagnosis not present

## 2015-02-16 LAB — CBC CANCER CENTER
BASOS ABS: 0.1 x10 3/mm (ref 0.0–0.1)
BASOS PCT: 0.7 %
EOS ABS: 0.3 x10 3/mm (ref 0.0–0.7)
Eosinophil %: 2.3 %
HCT: 40.7 % (ref 40.0–52.0)
HGB: 13.4 g/dL (ref 13.0–18.0)
LYMPHS ABS: 1.9 x10 3/mm (ref 1.0–3.6)
Lymphocyte %: 16.1 %
MCH: 29.1 pg (ref 26.0–34.0)
MCHC: 32.9 g/dL (ref 32.0–36.0)
MCV: 89 fL (ref 80–100)
Monocyte #: 1 x10 3/mm (ref 0.2–1.0)
Monocyte %: 8.6 %
NEUTROS ABS: 8.6 x10 3/mm — AB (ref 1.4–6.5)
Neutrophil %: 72.3 %
Platelet: 204 x10 3/mm (ref 150–440)
RBC: 4.6 10*6/uL (ref 4.40–5.90)
RDW: 17.7 % — AB (ref 11.5–14.5)
WBC: 11.9 x10 3/mm — ABNORMAL HIGH (ref 3.8–10.6)

## 2015-02-16 LAB — COMPREHENSIVE METABOLIC PANEL
ALK PHOS: 112 U/L
Albumin: 3.6 g/dL
Anion Gap: 9 (ref 7–16)
BILIRUBIN TOTAL: 1 mg/dL
BUN: 31 mg/dL — AB
CREATININE: 1.52 mg/dL — AB
Calcium, Total: 8.9 mg/dL
Chloride: 101 mmol/L
Co2: 25 mmol/L
GFR CALC AF AMER: 53 — AB
GFR CALC NON AF AMER: 46 — AB
Glucose: 148 mg/dL — ABNORMAL HIGH
POTASSIUM: 4 mmol/L
SGOT(AST): 23 U/L
SGPT (ALT): 74 U/L — ABNORMAL HIGH
SODIUM: 135 mmol/L
TOTAL PROTEIN: 7.4 g/dL

## 2015-02-16 LAB — TROPONIN I: TROPONIN-I: 0.05 ng/mL — AB

## 2015-02-16 LAB — TSH: Thyroid Stimulating Horm: 1.692 u[IU]/mL

## 2015-02-17 ENCOUNTER — Ambulatory Visit
Admit: 2015-02-17 | Disposition: A | Discharge status: Home / Self Care | Payer: Self-pay | Attending: Hematology and Oncology | Admitting: Hematology and Oncology

## 2015-02-17 LAB — URINALYSIS, COMPLETE
BILIRUBIN, UR: NEGATIVE
BLOOD: NEGATIVE
Bacteria: NONE SEEN
Glucose,UR: NEGATIVE mg/dL (ref 0–75)
KETONE: NEGATIVE
Nitrite: NEGATIVE
PH: 5 (ref 4.5–8.0)
PROTEIN: NEGATIVE
RBC,UR: 1 /HPF (ref 0–5)
SPECIFIC GRAVITY: 1.015 (ref 1.003–1.030)
Squamous Epithelial: <1
WBC UR: 14 /HPF (ref 0–5)

## 2015-02-17 LAB — CBC WITH DIFFERENTIAL/PLATELET
BASOS ABS: 0.1 10*3/uL (ref 0.0–0.1)
Basophil %: 0.4 %
EOS ABS: 0.1 10*3/uL (ref 0.0–0.7)
Eosinophil %: 0.5 %
HCT: 34.2 % — AB (ref 40.0–52.0)
HGB: 11.5 g/dL — AB (ref 13.0–18.0)
LYMPHS PCT: 6.7 %
Lymphocyte #: 1 10*3/uL (ref 1.0–3.6)
MCH: 29.8 pg (ref 26.0–34.0)
MCHC: 33.6 g/dL (ref 32.0–36.0)
MCV: 89 fL (ref 80–100)
MONOS PCT: 9.2 %
Monocyte #: 1.3 x10 3/mm — ABNORMAL HIGH (ref 0.2–1.0)
NEUTROS ABS: 12.2 10*3/uL — AB (ref 1.4–6.5)
NEUTROS PCT: 83.2 %
PLATELETS: 181 10*3/uL (ref 150–440)
RBC: 3.84 10*6/uL — ABNORMAL LOW (ref 4.40–5.90)
RDW: 18.3 % — AB (ref 11.5–14.5)
WBC: 14.7 10*3/uL — AB (ref 3.8–10.6)

## 2015-02-17 LAB — BASIC METABOLIC PANEL
Anion Gap: 11 (ref 7–16)
BUN: 25 mg/dL — AB
CHLORIDE: 105 mmol/L
CREATININE: 1.56 mg/dL — AB
Calcium, Total: 8.6 mg/dL — ABNORMAL LOW
Co2: 25 mmol/L
GFR CALC AF AMER: 51 — AB
GFR CALC NON AF AMER: 44 — AB
Glucose: 189 mg/dL — ABNORMAL HIGH
Potassium: 4.8 mmol/L
SODIUM: 141 mmol/L

## 2015-02-18 LAB — COMPREHENSIVE METABOLIC PANEL
ALT: 43 U/L
Albumin: 2.7 g/dL — ABNORMAL LOW
Alkaline Phosphatase: 92 U/L
Anion Gap: 10 (ref 7–16)
BUN: 16 mg/dL
Bilirubin,Total: 1.1 mg/dL
CALCIUM: 8.7 mg/dL — AB
Chloride: 110 mmol/L
Co2: 21 mmol/L — ABNORMAL LOW
Creatinine: 1.52 mg/dL — ABNORMAL HIGH
GFR CALC AF AMER: 53 — AB
GFR CALC NON AF AMER: 46 — AB
Glucose: 166 mg/dL — ABNORMAL HIGH
Potassium: 4.1 mmol/L
SGOT(AST): 30 U/L
SODIUM: 141 mmol/L
Total Protein: 6.7 g/dL

## 2015-02-18 LAB — CBC WITH DIFFERENTIAL/PLATELET
BASOS ABS: 0.2 10*3/uL — AB (ref 0.0–0.1)
Basophil %: 0.9 %
Eosinophil #: 0.2 10*3/uL (ref 0.0–0.7)
Eosinophil %: 1 %
HCT: 35.3 % — AB (ref 40.0–52.0)
HGB: 11.5 g/dL — ABNORMAL LOW (ref 13.0–18.0)
LYMPHS PCT: 10.9 %
Lymphocyte #: 2.2 10*3/uL (ref 1.0–3.6)
MCH: 29.6 pg (ref 26.0–34.0)
MCHC: 32.6 g/dL (ref 32.0–36.0)
MCV: 91 fL (ref 80–100)
MONOS PCT: 8.7 %
Monocyte #: 1.7 x10 3/mm — ABNORMAL HIGH (ref 0.2–1.0)
NEUTROS ABS: 15.8 10*3/uL — AB (ref 1.4–6.5)
NEUTROS PCT: 78.5 %
Platelet: 200 10*3/uL (ref 150–440)
RBC: 3.88 10*6/uL — ABNORMAL LOW (ref 4.40–5.90)
RDW: 18.6 % — AB (ref 11.5–14.5)
WBC: 20.1 10*3/uL — AB (ref 3.8–10.6)

## 2015-02-18 LAB — CULTURE, BLOOD (SINGLE)

## 2015-02-18 LAB — URINE CULTURE

## 2015-02-19 LAB — CBC WITH DIFFERENTIAL/PLATELET
BASOS PCT: 0.7 %
Basophil #: 0.1 10*3/uL (ref 0.0–0.1)
EOS PCT: 1.9 %
Eosinophil #: 0.3 10*3/uL (ref 0.0–0.7)
HCT: 30.1 % — ABNORMAL LOW (ref 40.0–52.0)
HGB: 9.8 g/dL — ABNORMAL LOW (ref 13.0–18.0)
LYMPHS PCT: 8.8 %
Lymphocyte #: 1.3 10*3/uL (ref 1.0–3.6)
MCH: 29.4 pg (ref 26.0–34.0)
MCHC: 32.7 g/dL (ref 32.0–36.0)
MCV: 90 fL (ref 80–100)
Monocyte #: 1.1 x10 3/mm — ABNORMAL HIGH (ref 0.2–1.0)
Monocyte %: 7.6 %
NEUTROS ABS: 11.7 10*3/uL — AB (ref 1.4–6.5)
Neutrophil %: 81 %
Platelet: 183 10*3/uL (ref 150–440)
RBC: 3.34 10*6/uL — AB (ref 4.40–5.90)
RDW: 18.5 % — ABNORMAL HIGH (ref 11.5–14.5)
WBC: 14.5 10*3/uL — AB (ref 3.8–10.6)

## 2015-02-19 LAB — BASIC METABOLIC PANEL
Anion Gap: 6 — ABNORMAL LOW (ref 7–16)
BUN: 16 mg/dL
CREATININE: 1.15 mg/dL
Calcium, Total: 8.2 mg/dL — ABNORMAL LOW
Chloride: 115 mmol/L — ABNORMAL HIGH
Co2: 22 mmol/L
GLUCOSE: 138 mg/dL — AB
Potassium: 4.1 mmol/L
Sodium: 143 mmol/L

## 2015-02-20 LAB — BASIC METABOLIC PANEL
ANION GAP: 9 (ref 7–16)
BUN: 19 mg/dL
Calcium, Total: 8.7 mg/dL — ABNORMAL LOW
Chloride: 116 mmol/L — ABNORMAL HIGH
Co2: 23 mmol/L
Creatinine: 1.04 mg/dL
GLUCOSE: 129 mg/dL — AB
POTASSIUM: 3.9 mmol/L
SODIUM: 148 mmol/L — AB

## 2015-02-20 LAB — SODIUM: SODIUM, URINE RANDOM: 144 mmol/L

## 2015-02-21 DIAGNOSIS — C719 Malignant neoplasm of brain, unspecified: Secondary | ICD-10-CM | POA: Diagnosis not present

## 2015-02-21 LAB — CBC WITH DIFFERENTIAL/PLATELET
Bands: 4 %
COMMENT - H1-COM2: NORMAL
Eosinophil: 3 %
HCT: 27.1 % — AB (ref 40.0–52.0)
HGB: 9 g/dL — ABNORMAL LOW (ref 13.0–18.0)
LYMPHS PCT: 17 %
MCH: 29.6 pg (ref 26.0–34.0)
MCHC: 33.1 g/dL (ref 32.0–36.0)
MCV: 89 fL (ref 80–100)
Monocytes: 5 %
NRBC/100 WBC: 2 /
PLATELETS: 211 10*3/uL (ref 150–440)
RBC: 3.04 10*6/uL — ABNORMAL LOW (ref 4.40–5.90)
RDW: 18.2 % — AB (ref 11.5–14.5)
Segmented Neutrophils: 71 %
WBC: 10.3 10*3/uL (ref 3.8–10.6)

## 2015-02-21 LAB — FOLATE: Folic Acid: 12.7 ng/mL

## 2015-02-21 LAB — CULTURE, BLOOD (SINGLE)

## 2015-02-21 LAB — AMMONIA: Ammonia, Plasma: 12 umol/L

## 2015-02-21 LAB — TSH: Thyroid Stimulating Horm: 2.157 u[IU]/mL

## 2015-02-21 LAB — FERRITIN: FERRITIN (ARMC): 1168 ng/mL — AB

## 2015-02-22 DIAGNOSIS — C719 Malignant neoplasm of brain, unspecified: Secondary | ICD-10-CM | POA: Diagnosis not present

## 2015-02-22 LAB — IRON AND TIBC
Iron Bind.Cap.(Total): 145 — ABNORMAL LOW (ref 250–450)
Iron Saturation: 28.3
Iron: 41 ug/dL — ABNORMAL LOW
Unbound Iron-Bind.Cap.: 103.9

## 2015-02-22 LAB — CLOSTRIDIUM DIFFICILE(ARMC)

## 2015-02-23 DIAGNOSIS — R443 Hallucinations, unspecified: Secondary | ICD-10-CM | POA: Diagnosis not present

## 2015-02-23 DIAGNOSIS — B379 Candidiasis, unspecified: Secondary | ICD-10-CM | POA: Diagnosis not present

## 2015-02-23 DIAGNOSIS — R630 Anorexia: Secondary | ICD-10-CM | POA: Diagnosis not present

## 2015-02-23 DIAGNOSIS — C801 Malignant (primary) neoplasm, unspecified: Secondary | ICD-10-CM | POA: Diagnosis not present

## 2015-02-23 DIAGNOSIS — N289 Disorder of kidney and ureter, unspecified: Secondary | ICD-10-CM | POA: Diagnosis not present

## 2015-02-23 DIAGNOSIS — R531 Weakness: Secondary | ICD-10-CM | POA: Diagnosis not present

## 2015-02-23 DIAGNOSIS — I252 Old myocardial infarction: Secondary | ICD-10-CM | POA: Diagnosis not present

## 2015-02-23 DIAGNOSIS — K5900 Constipation, unspecified: Secondary | ICD-10-CM | POA: Diagnosis not present

## 2015-02-23 DIAGNOSIS — K219 Gastro-esophageal reflux disease without esophagitis: Secondary | ICD-10-CM | POA: Diagnosis not present

## 2015-02-23 DIAGNOSIS — R262 Difficulty in walking, not elsewhere classified: Secondary | ICD-10-CM | POA: Diagnosis not present

## 2015-02-23 DIAGNOSIS — K760 Fatty (change of) liver, not elsewhere classified: Secondary | ICD-10-CM | POA: Diagnosis not present

## 2015-02-23 DIAGNOSIS — N179 Acute kidney failure, unspecified: Secondary | ICD-10-CM | POA: Diagnosis not present

## 2015-02-23 DIAGNOSIS — J159 Unspecified bacterial pneumonia: Secondary | ICD-10-CM | POA: Diagnosis not present

## 2015-02-23 DIAGNOSIS — I2510 Atherosclerotic heart disease of native coronary artery without angina pectoris: Secondary | ICD-10-CM | POA: Diagnosis not present

## 2015-02-23 DIAGNOSIS — R140 Abdominal distension (gaseous): Secondary | ICD-10-CM | POA: Diagnosis not present

## 2015-02-23 DIAGNOSIS — Z510 Encounter for antineoplastic radiation therapy: Secondary | ICD-10-CM | POA: Diagnosis not present

## 2015-02-23 DIAGNOSIS — D649 Anemia, unspecified: Secondary | ICD-10-CM | POA: Diagnosis not present

## 2015-02-23 DIAGNOSIS — R51 Headache: Secondary | ICD-10-CM | POA: Diagnosis not present

## 2015-02-23 DIAGNOSIS — N390 Urinary tract infection, site not specified: Secondary | ICD-10-CM | POA: Diagnosis not present

## 2015-02-23 DIAGNOSIS — R4182 Altered mental status, unspecified: Secondary | ICD-10-CM | POA: Diagnosis not present

## 2015-02-23 DIAGNOSIS — Z5189 Encounter for other specified aftercare: Secondary | ICD-10-CM | POA: Diagnosis not present

## 2015-02-23 DIAGNOSIS — I517 Cardiomegaly: Secondary | ICD-10-CM | POA: Diagnosis not present

## 2015-02-23 DIAGNOSIS — E785 Hyperlipidemia, unspecified: Secondary | ICD-10-CM | POA: Diagnosis not present

## 2015-02-23 DIAGNOSIS — C719 Malignant neoplasm of brain, unspecified: Secondary | ICD-10-CM | POA: Diagnosis not present

## 2015-02-23 DIAGNOSIS — E784 Other hyperlipidemia: Secondary | ICD-10-CM | POA: Diagnosis not present

## 2015-02-23 DIAGNOSIS — N400 Enlarged prostate without lower urinary tract symptoms: Secondary | ICD-10-CM | POA: Diagnosis not present

## 2015-02-23 DIAGNOSIS — E860 Dehydration: Secondary | ICD-10-CM | POA: Diagnosis not present

## 2015-02-23 DIAGNOSIS — J189 Pneumonia, unspecified organism: Secondary | ICD-10-CM | POA: Diagnosis not present

## 2015-02-23 DIAGNOSIS — K828 Other specified diseases of gallbladder: Secondary | ICD-10-CM | POA: Diagnosis not present

## 2015-02-23 DIAGNOSIS — R160 Hepatomegaly, not elsewhere classified: Secondary | ICD-10-CM | POA: Diagnosis not present

## 2015-02-23 DIAGNOSIS — R109 Unspecified abdominal pain: Secondary | ICD-10-CM | POA: Diagnosis not present

## 2015-02-23 DIAGNOSIS — I10 Essential (primary) hypertension: Secondary | ICD-10-CM | POA: Diagnosis not present

## 2015-02-23 DIAGNOSIS — J690 Pneumonitis due to inhalation of food and vomit: Secondary | ICD-10-CM | POA: Diagnosis not present

## 2015-02-23 DIAGNOSIS — R1030 Lower abdominal pain, unspecified: Secondary | ICD-10-CM | POA: Diagnosis not present

## 2015-02-23 DIAGNOSIS — Z515 Encounter for palliative care: Secondary | ICD-10-CM | POA: Diagnosis not present

## 2015-02-23 DIAGNOSIS — M6281 Muscle weakness (generalized): Secondary | ICD-10-CM | POA: Diagnosis not present

## 2015-02-24 DIAGNOSIS — N390 Urinary tract infection, site not specified: Secondary | ICD-10-CM | POA: Diagnosis not present

## 2015-02-24 DIAGNOSIS — B379 Candidiasis, unspecified: Secondary | ICD-10-CM | POA: Diagnosis not present

## 2015-02-24 DIAGNOSIS — R443 Hallucinations, unspecified: Secondary | ICD-10-CM | POA: Diagnosis not present

## 2015-02-24 DIAGNOSIS — R140 Abdominal distension (gaseous): Secondary | ICD-10-CM | POA: Diagnosis not present

## 2015-02-24 DIAGNOSIS — R531 Weakness: Secondary | ICD-10-CM | POA: Diagnosis not present

## 2015-02-24 DIAGNOSIS — R51 Headache: Secondary | ICD-10-CM | POA: Diagnosis not present

## 2015-02-24 DIAGNOSIS — R630 Anorexia: Secondary | ICD-10-CM | POA: Diagnosis not present

## 2015-02-24 DIAGNOSIS — I2510 Atherosclerotic heart disease of native coronary artery without angina pectoris: Secondary | ICD-10-CM | POA: Diagnosis not present

## 2015-02-24 DIAGNOSIS — N179 Acute kidney failure, unspecified: Secondary | ICD-10-CM | POA: Diagnosis not present

## 2015-02-24 DIAGNOSIS — K760 Fatty (change of) liver, not elsewhere classified: Secondary | ICD-10-CM | POA: Diagnosis not present

## 2015-02-24 DIAGNOSIS — Z515 Encounter for palliative care: Secondary | ICD-10-CM | POA: Diagnosis not present

## 2015-02-24 DIAGNOSIS — I517 Cardiomegaly: Secondary | ICD-10-CM | POA: Diagnosis not present

## 2015-02-24 DIAGNOSIS — R4182 Altered mental status, unspecified: Secondary | ICD-10-CM | POA: Diagnosis not present

## 2015-02-24 DIAGNOSIS — K828 Other specified diseases of gallbladder: Secondary | ICD-10-CM | POA: Diagnosis not present

## 2015-02-24 DIAGNOSIS — R160 Hepatomegaly, not elsewhere classified: Secondary | ICD-10-CM | POA: Diagnosis not present

## 2015-02-24 DIAGNOSIS — E860 Dehydration: Secondary | ICD-10-CM | POA: Diagnosis not present

## 2015-02-24 DIAGNOSIS — K219 Gastro-esophageal reflux disease without esophagitis: Secondary | ICD-10-CM | POA: Diagnosis not present

## 2015-02-24 DIAGNOSIS — N400 Enlarged prostate without lower urinary tract symptoms: Secondary | ICD-10-CM | POA: Diagnosis not present

## 2015-02-24 DIAGNOSIS — Z510 Encounter for antineoplastic radiation therapy: Secondary | ICD-10-CM | POA: Diagnosis not present

## 2015-02-24 DIAGNOSIS — J189 Pneumonia, unspecified organism: Secondary | ICD-10-CM | POA: Diagnosis not present

## 2015-02-24 DIAGNOSIS — N289 Disorder of kidney and ureter, unspecified: Secondary | ICD-10-CM | POA: Diagnosis not present

## 2015-02-24 DIAGNOSIS — I252 Old myocardial infarction: Secondary | ICD-10-CM | POA: Diagnosis not present

## 2015-02-24 DIAGNOSIS — K5900 Constipation, unspecified: Secondary | ICD-10-CM | POA: Diagnosis not present

## 2015-02-24 DIAGNOSIS — R109 Unspecified abdominal pain: Secondary | ICD-10-CM | POA: Diagnosis not present

## 2015-02-24 DIAGNOSIS — C719 Malignant neoplasm of brain, unspecified: Secondary | ICD-10-CM | POA: Diagnosis not present

## 2015-02-27 DIAGNOSIS — R160 Hepatomegaly, not elsewhere classified: Secondary | ICD-10-CM | POA: Diagnosis not present

## 2015-02-27 DIAGNOSIS — J189 Pneumonia, unspecified organism: Secondary | ICD-10-CM | POA: Diagnosis not present

## 2015-02-27 DIAGNOSIS — K5900 Constipation, unspecified: Secondary | ICD-10-CM | POA: Diagnosis not present

## 2015-02-27 DIAGNOSIS — R140 Abdominal distension (gaseous): Secondary | ICD-10-CM | POA: Diagnosis not present

## 2015-02-27 DIAGNOSIS — R443 Hallucinations, unspecified: Secondary | ICD-10-CM | POA: Diagnosis not present

## 2015-02-27 DIAGNOSIS — K760 Fatty (change of) liver, not elsewhere classified: Secondary | ICD-10-CM | POA: Diagnosis not present

## 2015-02-27 DIAGNOSIS — R630 Anorexia: Secondary | ICD-10-CM | POA: Diagnosis not present

## 2015-02-27 DIAGNOSIS — N390 Urinary tract infection, site not specified: Secondary | ICD-10-CM | POA: Diagnosis not present

## 2015-02-27 DIAGNOSIS — E860 Dehydration: Secondary | ICD-10-CM | POA: Diagnosis not present

## 2015-02-27 DIAGNOSIS — B379 Candidiasis, unspecified: Secondary | ICD-10-CM | POA: Diagnosis not present

## 2015-02-27 DIAGNOSIS — I517 Cardiomegaly: Secondary | ICD-10-CM | POA: Diagnosis not present

## 2015-02-27 DIAGNOSIS — K828 Other specified diseases of gallbladder: Secondary | ICD-10-CM | POA: Diagnosis not present

## 2015-02-27 DIAGNOSIS — I2510 Atherosclerotic heart disease of native coronary artery without angina pectoris: Secondary | ICD-10-CM | POA: Diagnosis not present

## 2015-02-27 DIAGNOSIS — R51 Headache: Secondary | ICD-10-CM | POA: Diagnosis not present

## 2015-02-27 DIAGNOSIS — R109 Unspecified abdominal pain: Secondary | ICD-10-CM | POA: Diagnosis not present

## 2015-02-27 DIAGNOSIS — Z515 Encounter for palliative care: Secondary | ICD-10-CM | POA: Diagnosis not present

## 2015-02-27 DIAGNOSIS — N400 Enlarged prostate without lower urinary tract symptoms: Secondary | ICD-10-CM | POA: Diagnosis not present

## 2015-02-27 DIAGNOSIS — R531 Weakness: Secondary | ICD-10-CM | POA: Diagnosis not present

## 2015-02-27 DIAGNOSIS — N289 Disorder of kidney and ureter, unspecified: Secondary | ICD-10-CM | POA: Diagnosis not present

## 2015-02-27 DIAGNOSIS — Z510 Encounter for antineoplastic radiation therapy: Secondary | ICD-10-CM | POA: Diagnosis not present

## 2015-02-27 DIAGNOSIS — C719 Malignant neoplasm of brain, unspecified: Secondary | ICD-10-CM | POA: Diagnosis not present

## 2015-02-27 DIAGNOSIS — R4182 Altered mental status, unspecified: Secondary | ICD-10-CM | POA: Diagnosis not present

## 2015-02-27 DIAGNOSIS — I252 Old myocardial infarction: Secondary | ICD-10-CM | POA: Diagnosis not present

## 2015-02-27 DIAGNOSIS — K219 Gastro-esophageal reflux disease without esophagitis: Secondary | ICD-10-CM | POA: Diagnosis not present

## 2015-02-27 DIAGNOSIS — N179 Acute kidney failure, unspecified: Secondary | ICD-10-CM | POA: Diagnosis not present

## 2015-02-27 LAB — COMPREHENSIVE METABOLIC PANEL
Albumin: 2.6 g/dL — ABNORMAL LOW
Alkaline Phosphatase: 101 U/L
Anion Gap: 12 (ref 7–16)
BUN: 18 mg/dL
Bilirubin,Total: 0.9 mg/dL
Calcium, Total: 8.4 mg/dL — ABNORMAL LOW
Chloride: 102 mmol/L
Co2: 23 mmol/L
Creatinine: 1.33 mg/dL — ABNORMAL HIGH
EGFR (African American): >60
EGFR (Non-African Amer.): 54 — ABNORMAL LOW
Glucose: 172 mg/dL — ABNORMAL HIGH
Potassium: 3.8 mmol/L
SGOT(AST): 18 U/L
SGPT (ALT): 42 U/L
Sodium: 137 mmol/L
Total Protein: 7.3 g/dL

## 2015-02-27 LAB — CBC CANCER CENTER
Basophil #: 0 x10 3/mm (ref 0.0–0.1)
Basophil %: 0.3 %
Eosinophil #: 0.2 x10 3/mm (ref 0.0–0.7)
Eosinophil %: 1.9 %
HCT: 30.8 % — ABNORMAL LOW (ref 40.0–52.0)
HGB: 10.1 g/dL — ABNORMAL LOW (ref 13.0–18.0)
Lymphocyte #: 0.9 x10 3/mm — ABNORMAL LOW (ref 1.0–3.6)
Lymphocyte %: 7.5 %
MCH: 29 pg (ref 26.0–34.0)
MCHC: 32.7 g/dL (ref 32.0–36.0)
MCV: 89 fL (ref 80–100)
Monocyte #: 0.5 x10 3/mm (ref 0.2–1.0)
Monocyte %: 3.6 %
Neutrophil #: 10.7 x10 3/mm — ABNORMAL HIGH (ref 1.4–6.5)
Neutrophil %: 86.7 %
Platelet: 360 x10 3/mm (ref 150–440)
RBC: 3.47 10*6/uL — ABNORMAL LOW (ref 4.40–5.90)
RDW: 18.5 % — ABNORMAL HIGH (ref 11.5–14.5)
WBC: 12.4 x10 3/mm — ABNORMAL HIGH (ref 3.8–10.6)

## 2015-02-28 DIAGNOSIS — E784 Other hyperlipidemia: Secondary | ICD-10-CM | POA: Diagnosis not present

## 2015-02-28 DIAGNOSIS — J189 Pneumonia, unspecified organism: Secondary | ICD-10-CM | POA: Diagnosis not present

## 2015-02-28 DIAGNOSIS — N289 Disorder of kidney and ureter, unspecified: Secondary | ICD-10-CM | POA: Diagnosis not present

## 2015-02-28 DIAGNOSIS — K219 Gastro-esophageal reflux disease without esophagitis: Secondary | ICD-10-CM | POA: Diagnosis not present

## 2015-02-28 DIAGNOSIS — B379 Candidiasis, unspecified: Secondary | ICD-10-CM | POA: Diagnosis not present

## 2015-02-28 DIAGNOSIS — K760 Fatty (change of) liver, not elsewhere classified: Secondary | ICD-10-CM | POA: Diagnosis not present

## 2015-02-28 DIAGNOSIS — R109 Unspecified abdominal pain: Secondary | ICD-10-CM | POA: Diagnosis not present

## 2015-02-28 DIAGNOSIS — I252 Old myocardial infarction: Secondary | ICD-10-CM | POA: Diagnosis not present

## 2015-02-28 DIAGNOSIS — R140 Abdominal distension (gaseous): Secondary | ICD-10-CM | POA: Diagnosis not present

## 2015-02-28 DIAGNOSIS — N400 Enlarged prostate without lower urinary tract symptoms: Secondary | ICD-10-CM | POA: Diagnosis not present

## 2015-02-28 DIAGNOSIS — N179 Acute kidney failure, unspecified: Secondary | ICD-10-CM | POA: Diagnosis not present

## 2015-02-28 DIAGNOSIS — C719 Malignant neoplasm of brain, unspecified: Secondary | ICD-10-CM | POA: Diagnosis not present

## 2015-02-28 DIAGNOSIS — I10 Essential (primary) hypertension: Secondary | ICD-10-CM | POA: Diagnosis not present

## 2015-02-28 DIAGNOSIS — R531 Weakness: Secondary | ICD-10-CM | POA: Diagnosis not present

## 2015-02-28 DIAGNOSIS — R630 Anorexia: Secondary | ICD-10-CM | POA: Diagnosis not present

## 2015-02-28 DIAGNOSIS — Z515 Encounter for palliative care: Secondary | ICD-10-CM | POA: Diagnosis not present

## 2015-02-28 DIAGNOSIS — R4182 Altered mental status, unspecified: Secondary | ICD-10-CM | POA: Diagnosis not present

## 2015-02-28 DIAGNOSIS — R160 Hepatomegaly, not elsewhere classified: Secondary | ICD-10-CM | POA: Diagnosis not present

## 2015-02-28 DIAGNOSIS — R443 Hallucinations, unspecified: Secondary | ICD-10-CM | POA: Diagnosis not present

## 2015-02-28 DIAGNOSIS — I2510 Atherosclerotic heart disease of native coronary artery without angina pectoris: Secondary | ICD-10-CM | POA: Diagnosis not present

## 2015-02-28 DIAGNOSIS — N390 Urinary tract infection, site not specified: Secondary | ICD-10-CM | POA: Diagnosis not present

## 2015-02-28 DIAGNOSIS — K828 Other specified diseases of gallbladder: Secondary | ICD-10-CM | POA: Diagnosis not present

## 2015-02-28 DIAGNOSIS — I517 Cardiomegaly: Secondary | ICD-10-CM | POA: Diagnosis not present

## 2015-02-28 DIAGNOSIS — Z510 Encounter for antineoplastic radiation therapy: Secondary | ICD-10-CM | POA: Diagnosis not present

## 2015-02-28 DIAGNOSIS — R51 Headache: Secondary | ICD-10-CM | POA: Diagnosis not present

## 2015-02-28 DIAGNOSIS — K5900 Constipation, unspecified: Secondary | ICD-10-CM | POA: Diagnosis not present

## 2015-02-28 DIAGNOSIS — E860 Dehydration: Secondary | ICD-10-CM | POA: Diagnosis not present

## 2015-03-01 DIAGNOSIS — K760 Fatty (change of) liver, not elsewhere classified: Secondary | ICD-10-CM | POA: Diagnosis not present

## 2015-03-01 DIAGNOSIS — K828 Other specified diseases of gallbladder: Secondary | ICD-10-CM | POA: Diagnosis not present

## 2015-03-01 DIAGNOSIS — R109 Unspecified abdominal pain: Secondary | ICD-10-CM | POA: Diagnosis not present

## 2015-03-01 DIAGNOSIS — K5900 Constipation, unspecified: Secondary | ICD-10-CM | POA: Diagnosis not present

## 2015-03-01 DIAGNOSIS — R630 Anorexia: Secondary | ICD-10-CM | POA: Diagnosis not present

## 2015-03-01 DIAGNOSIS — I2510 Atherosclerotic heart disease of native coronary artery without angina pectoris: Secondary | ICD-10-CM | POA: Diagnosis not present

## 2015-03-01 DIAGNOSIS — I252 Old myocardial infarction: Secondary | ICD-10-CM | POA: Diagnosis not present

## 2015-03-01 DIAGNOSIS — E860 Dehydration: Secondary | ICD-10-CM | POA: Diagnosis not present

## 2015-03-01 DIAGNOSIS — Z515 Encounter for palliative care: Secondary | ICD-10-CM | POA: Diagnosis not present

## 2015-03-01 DIAGNOSIS — J189 Pneumonia, unspecified organism: Secondary | ICD-10-CM | POA: Diagnosis not present

## 2015-03-01 DIAGNOSIS — R531 Weakness: Secondary | ICD-10-CM | POA: Diagnosis not present

## 2015-03-01 DIAGNOSIS — R4182 Altered mental status, unspecified: Secondary | ICD-10-CM | POA: Diagnosis not present

## 2015-03-01 DIAGNOSIS — N400 Enlarged prostate without lower urinary tract symptoms: Secondary | ICD-10-CM | POA: Diagnosis not present

## 2015-03-01 DIAGNOSIS — R140 Abdominal distension (gaseous): Secondary | ICD-10-CM | POA: Diagnosis not present

## 2015-03-01 DIAGNOSIS — K219 Gastro-esophageal reflux disease without esophagitis: Secondary | ICD-10-CM | POA: Diagnosis not present

## 2015-03-01 DIAGNOSIS — N289 Disorder of kidney and ureter, unspecified: Secondary | ICD-10-CM | POA: Diagnosis not present

## 2015-03-01 DIAGNOSIS — B379 Candidiasis, unspecified: Secondary | ICD-10-CM | POA: Diagnosis not present

## 2015-03-01 DIAGNOSIS — R51 Headache: Secondary | ICD-10-CM | POA: Diagnosis not present

## 2015-03-01 DIAGNOSIS — I517 Cardiomegaly: Secondary | ICD-10-CM | POA: Diagnosis not present

## 2015-03-01 DIAGNOSIS — R160 Hepatomegaly, not elsewhere classified: Secondary | ICD-10-CM | POA: Diagnosis not present

## 2015-03-01 DIAGNOSIS — Z510 Encounter for antineoplastic radiation therapy: Secondary | ICD-10-CM | POA: Diagnosis not present

## 2015-03-01 DIAGNOSIS — N179 Acute kidney failure, unspecified: Secondary | ICD-10-CM | POA: Diagnosis not present

## 2015-03-01 DIAGNOSIS — R443 Hallucinations, unspecified: Secondary | ICD-10-CM | POA: Diagnosis not present

## 2015-03-01 DIAGNOSIS — N390 Urinary tract infection, site not specified: Secondary | ICD-10-CM | POA: Diagnosis not present

## 2015-03-01 DIAGNOSIS — C719 Malignant neoplasm of brain, unspecified: Secondary | ICD-10-CM | POA: Diagnosis not present

## 2015-03-06 DIAGNOSIS — N400 Enlarged prostate without lower urinary tract symptoms: Secondary | ICD-10-CM | POA: Diagnosis not present

## 2015-03-06 DIAGNOSIS — J189 Pneumonia, unspecified organism: Secondary | ICD-10-CM | POA: Diagnosis not present

## 2015-03-06 DIAGNOSIS — N289 Disorder of kidney and ureter, unspecified: Secondary | ICD-10-CM | POA: Diagnosis not present

## 2015-03-06 DIAGNOSIS — E784 Other hyperlipidemia: Secondary | ICD-10-CM | POA: Diagnosis not present

## 2015-03-06 DIAGNOSIS — I2510 Atherosclerotic heart disease of native coronary artery without angina pectoris: Secondary | ICD-10-CM | POA: Diagnosis not present

## 2015-03-06 DIAGNOSIS — K760 Fatty (change of) liver, not elsewhere classified: Secondary | ICD-10-CM | POA: Diagnosis not present

## 2015-03-06 DIAGNOSIS — N390 Urinary tract infection, site not specified: Secondary | ICD-10-CM | POA: Diagnosis not present

## 2015-03-06 DIAGNOSIS — K219 Gastro-esophageal reflux disease without esophagitis: Secondary | ICD-10-CM | POA: Diagnosis not present

## 2015-03-06 DIAGNOSIS — K828 Other specified diseases of gallbladder: Secondary | ICD-10-CM | POA: Diagnosis not present

## 2015-03-06 DIAGNOSIS — E860 Dehydration: Secondary | ICD-10-CM | POA: Diagnosis not present

## 2015-03-06 DIAGNOSIS — R140 Abdominal distension (gaseous): Secondary | ICD-10-CM | POA: Diagnosis not present

## 2015-03-06 DIAGNOSIS — R4182 Altered mental status, unspecified: Secondary | ICD-10-CM | POA: Diagnosis not present

## 2015-03-06 DIAGNOSIS — Z515 Encounter for palliative care: Secondary | ICD-10-CM | POA: Diagnosis not present

## 2015-03-06 DIAGNOSIS — R443 Hallucinations, unspecified: Secondary | ICD-10-CM | POA: Diagnosis not present

## 2015-03-06 DIAGNOSIS — I252 Old myocardial infarction: Secondary | ICD-10-CM | POA: Diagnosis not present

## 2015-03-06 DIAGNOSIS — R109 Unspecified abdominal pain: Secondary | ICD-10-CM | POA: Diagnosis not present

## 2015-03-06 DIAGNOSIS — B379 Candidiasis, unspecified: Secondary | ICD-10-CM | POA: Diagnosis not present

## 2015-03-06 DIAGNOSIS — R160 Hepatomegaly, not elsewhere classified: Secondary | ICD-10-CM | POA: Diagnosis not present

## 2015-03-06 DIAGNOSIS — I10 Essential (primary) hypertension: Secondary | ICD-10-CM | POA: Diagnosis not present

## 2015-03-06 DIAGNOSIS — D649 Anemia, unspecified: Secondary | ICD-10-CM | POA: Diagnosis not present

## 2015-03-06 DIAGNOSIS — R531 Weakness: Secondary | ICD-10-CM | POA: Diagnosis not present

## 2015-03-06 DIAGNOSIS — K5900 Constipation, unspecified: Secondary | ICD-10-CM | POA: Diagnosis not present

## 2015-03-06 DIAGNOSIS — I517 Cardiomegaly: Secondary | ICD-10-CM | POA: Diagnosis not present

## 2015-03-06 DIAGNOSIS — R630 Anorexia: Secondary | ICD-10-CM | POA: Diagnosis not present

## 2015-03-06 DIAGNOSIS — C719 Malignant neoplasm of brain, unspecified: Secondary | ICD-10-CM | POA: Diagnosis not present

## 2015-03-06 DIAGNOSIS — N179 Acute kidney failure, unspecified: Secondary | ICD-10-CM | POA: Diagnosis not present

## 2015-03-06 DIAGNOSIS — Z510 Encounter for antineoplastic radiation therapy: Secondary | ICD-10-CM | POA: Diagnosis not present

## 2015-03-06 DIAGNOSIS — R51 Headache: Secondary | ICD-10-CM | POA: Diagnosis not present

## 2015-03-07 DIAGNOSIS — N289 Disorder of kidney and ureter, unspecified: Secondary | ICD-10-CM | POA: Diagnosis not present

## 2015-03-07 DIAGNOSIS — B379 Candidiasis, unspecified: Secondary | ICD-10-CM | POA: Diagnosis not present

## 2015-03-07 DIAGNOSIS — E860 Dehydration: Secondary | ICD-10-CM | POA: Diagnosis not present

## 2015-03-07 DIAGNOSIS — K760 Fatty (change of) liver, not elsewhere classified: Secondary | ICD-10-CM | POA: Diagnosis not present

## 2015-03-07 DIAGNOSIS — R630 Anorexia: Secondary | ICD-10-CM | POA: Diagnosis not present

## 2015-03-07 DIAGNOSIS — Z510 Encounter for antineoplastic radiation therapy: Secondary | ICD-10-CM | POA: Diagnosis not present

## 2015-03-07 DIAGNOSIS — I517 Cardiomegaly: Secondary | ICD-10-CM | POA: Diagnosis not present

## 2015-03-07 DIAGNOSIS — J189 Pneumonia, unspecified organism: Secondary | ICD-10-CM | POA: Diagnosis not present

## 2015-03-07 DIAGNOSIS — R4182 Altered mental status, unspecified: Secondary | ICD-10-CM | POA: Diagnosis not present

## 2015-03-07 DIAGNOSIS — R51 Headache: Secondary | ICD-10-CM | POA: Diagnosis not present

## 2015-03-07 DIAGNOSIS — I2510 Atherosclerotic heart disease of native coronary artery without angina pectoris: Secondary | ICD-10-CM | POA: Diagnosis not present

## 2015-03-07 DIAGNOSIS — N390 Urinary tract infection, site not specified: Secondary | ICD-10-CM | POA: Diagnosis not present

## 2015-03-07 DIAGNOSIS — R531 Weakness: Secondary | ICD-10-CM | POA: Diagnosis not present

## 2015-03-07 DIAGNOSIS — K828 Other specified diseases of gallbladder: Secondary | ICD-10-CM | POA: Diagnosis not present

## 2015-03-07 DIAGNOSIS — N179 Acute kidney failure, unspecified: Secondary | ICD-10-CM | POA: Diagnosis not present

## 2015-03-07 DIAGNOSIS — R109 Unspecified abdominal pain: Secondary | ICD-10-CM | POA: Diagnosis not present

## 2015-03-07 DIAGNOSIS — R443 Hallucinations, unspecified: Secondary | ICD-10-CM | POA: Diagnosis not present

## 2015-03-07 DIAGNOSIS — Z515 Encounter for palliative care: Secondary | ICD-10-CM | POA: Diagnosis not present

## 2015-03-07 DIAGNOSIS — R140 Abdominal distension (gaseous): Secondary | ICD-10-CM | POA: Diagnosis not present

## 2015-03-07 DIAGNOSIS — R160 Hepatomegaly, not elsewhere classified: Secondary | ICD-10-CM | POA: Diagnosis not present

## 2015-03-07 DIAGNOSIS — N400 Enlarged prostate without lower urinary tract symptoms: Secondary | ICD-10-CM | POA: Diagnosis not present

## 2015-03-07 DIAGNOSIS — C719 Malignant neoplasm of brain, unspecified: Secondary | ICD-10-CM | POA: Diagnosis not present

## 2015-03-07 DIAGNOSIS — K219 Gastro-esophageal reflux disease without esophagitis: Secondary | ICD-10-CM | POA: Diagnosis not present

## 2015-03-07 DIAGNOSIS — K5900 Constipation, unspecified: Secondary | ICD-10-CM | POA: Diagnosis not present

## 2015-03-07 DIAGNOSIS — I252 Old myocardial infarction: Secondary | ICD-10-CM | POA: Diagnosis not present

## 2015-03-08 DIAGNOSIS — I2510 Atherosclerotic heart disease of native coronary artery without angina pectoris: Secondary | ICD-10-CM | POA: Diagnosis not present

## 2015-03-08 DIAGNOSIS — B379 Candidiasis, unspecified: Secondary | ICD-10-CM | POA: Diagnosis not present

## 2015-03-08 DIAGNOSIS — R51 Headache: Secondary | ICD-10-CM | POA: Diagnosis not present

## 2015-03-08 DIAGNOSIS — J189 Pneumonia, unspecified organism: Secondary | ICD-10-CM | POA: Diagnosis not present

## 2015-03-08 DIAGNOSIS — R443 Hallucinations, unspecified: Secondary | ICD-10-CM | POA: Diagnosis not present

## 2015-03-08 DIAGNOSIS — N400 Enlarged prostate without lower urinary tract symptoms: Secondary | ICD-10-CM | POA: Diagnosis not present

## 2015-03-08 DIAGNOSIS — I517 Cardiomegaly: Secondary | ICD-10-CM | POA: Diagnosis not present

## 2015-03-08 DIAGNOSIS — K219 Gastro-esophageal reflux disease without esophagitis: Secondary | ICD-10-CM | POA: Diagnosis not present

## 2015-03-08 DIAGNOSIS — N390 Urinary tract infection, site not specified: Secondary | ICD-10-CM | POA: Diagnosis not present

## 2015-03-08 DIAGNOSIS — E860 Dehydration: Secondary | ICD-10-CM | POA: Diagnosis not present

## 2015-03-08 DIAGNOSIS — Z515 Encounter for palliative care: Secondary | ICD-10-CM | POA: Diagnosis not present

## 2015-03-08 DIAGNOSIS — R140 Abdominal distension (gaseous): Secondary | ICD-10-CM | POA: Diagnosis not present

## 2015-03-08 DIAGNOSIS — K760 Fatty (change of) liver, not elsewhere classified: Secondary | ICD-10-CM | POA: Diagnosis not present

## 2015-03-08 DIAGNOSIS — Z510 Encounter for antineoplastic radiation therapy: Secondary | ICD-10-CM | POA: Diagnosis not present

## 2015-03-08 DIAGNOSIS — R160 Hepatomegaly, not elsewhere classified: Secondary | ICD-10-CM | POA: Diagnosis not present

## 2015-03-08 DIAGNOSIS — R630 Anorexia: Secondary | ICD-10-CM | POA: Diagnosis not present

## 2015-03-08 DIAGNOSIS — R531 Weakness: Secondary | ICD-10-CM | POA: Diagnosis not present

## 2015-03-08 DIAGNOSIS — K5900 Constipation, unspecified: Secondary | ICD-10-CM | POA: Diagnosis not present

## 2015-03-08 DIAGNOSIS — N289 Disorder of kidney and ureter, unspecified: Secondary | ICD-10-CM | POA: Diagnosis not present

## 2015-03-08 DIAGNOSIS — R4182 Altered mental status, unspecified: Secondary | ICD-10-CM | POA: Diagnosis not present

## 2015-03-08 DIAGNOSIS — K828 Other specified diseases of gallbladder: Secondary | ICD-10-CM | POA: Diagnosis not present

## 2015-03-08 DIAGNOSIS — N179 Acute kidney failure, unspecified: Secondary | ICD-10-CM | POA: Diagnosis not present

## 2015-03-08 DIAGNOSIS — R109 Unspecified abdominal pain: Secondary | ICD-10-CM | POA: Diagnosis not present

## 2015-03-08 DIAGNOSIS — I252 Old myocardial infarction: Secondary | ICD-10-CM | POA: Diagnosis not present

## 2015-03-08 DIAGNOSIS — C719 Malignant neoplasm of brain, unspecified: Secondary | ICD-10-CM | POA: Diagnosis not present

## 2015-03-08 LAB — EXPECTORATED SPUTUM ASSESSMENT W GRAM STAIN, RFLX TO RESP C

## 2015-03-09 DIAGNOSIS — K828 Other specified diseases of gallbladder: Secondary | ICD-10-CM | POA: Diagnosis not present

## 2015-03-09 DIAGNOSIS — B379 Candidiasis, unspecified: Secondary | ICD-10-CM | POA: Diagnosis not present

## 2015-03-09 DIAGNOSIS — I252 Old myocardial infarction: Secondary | ICD-10-CM | POA: Diagnosis not present

## 2015-03-09 DIAGNOSIS — N289 Disorder of kidney and ureter, unspecified: Secondary | ICD-10-CM | POA: Diagnosis not present

## 2015-03-09 DIAGNOSIS — K5900 Constipation, unspecified: Secondary | ICD-10-CM | POA: Diagnosis not present

## 2015-03-09 DIAGNOSIS — R443 Hallucinations, unspecified: Secondary | ICD-10-CM | POA: Diagnosis not present

## 2015-03-09 DIAGNOSIS — R531 Weakness: Secondary | ICD-10-CM | POA: Diagnosis not present

## 2015-03-09 DIAGNOSIS — R630 Anorexia: Secondary | ICD-10-CM | POA: Diagnosis not present

## 2015-03-09 DIAGNOSIS — N390 Urinary tract infection, site not specified: Secondary | ICD-10-CM | POA: Diagnosis not present

## 2015-03-09 DIAGNOSIS — R109 Unspecified abdominal pain: Secondary | ICD-10-CM | POA: Diagnosis not present

## 2015-03-09 DIAGNOSIS — R4182 Altered mental status, unspecified: Secondary | ICD-10-CM | POA: Diagnosis not present

## 2015-03-09 DIAGNOSIS — R160 Hepatomegaly, not elsewhere classified: Secondary | ICD-10-CM | POA: Diagnosis not present

## 2015-03-09 DIAGNOSIS — I517 Cardiomegaly: Secondary | ICD-10-CM | POA: Diagnosis not present

## 2015-03-09 DIAGNOSIS — J189 Pneumonia, unspecified organism: Secondary | ICD-10-CM | POA: Diagnosis not present

## 2015-03-09 DIAGNOSIS — K760 Fatty (change of) liver, not elsewhere classified: Secondary | ICD-10-CM | POA: Diagnosis not present

## 2015-03-09 DIAGNOSIS — N179 Acute kidney failure, unspecified: Secondary | ICD-10-CM | POA: Diagnosis not present

## 2015-03-09 DIAGNOSIS — K219 Gastro-esophageal reflux disease without esophagitis: Secondary | ICD-10-CM | POA: Diagnosis not present

## 2015-03-09 DIAGNOSIS — C719 Malignant neoplasm of brain, unspecified: Secondary | ICD-10-CM | POA: Diagnosis not present

## 2015-03-09 DIAGNOSIS — N400 Enlarged prostate without lower urinary tract symptoms: Secondary | ICD-10-CM | POA: Diagnosis not present

## 2015-03-09 DIAGNOSIS — E860 Dehydration: Secondary | ICD-10-CM | POA: Diagnosis not present

## 2015-03-09 DIAGNOSIS — R51 Headache: Secondary | ICD-10-CM | POA: Diagnosis not present

## 2015-03-09 DIAGNOSIS — Z510 Encounter for antineoplastic radiation therapy: Secondary | ICD-10-CM | POA: Diagnosis not present

## 2015-03-09 DIAGNOSIS — I2510 Atherosclerotic heart disease of native coronary artery without angina pectoris: Secondary | ICD-10-CM | POA: Diagnosis not present

## 2015-03-09 DIAGNOSIS — R140 Abdominal distension (gaseous): Secondary | ICD-10-CM | POA: Diagnosis not present

## 2015-03-09 DIAGNOSIS — Z515 Encounter for palliative care: Secondary | ICD-10-CM | POA: Diagnosis not present

## 2015-03-10 DIAGNOSIS — R160 Hepatomegaly, not elsewhere classified: Secondary | ICD-10-CM | POA: Diagnosis not present

## 2015-03-10 DIAGNOSIS — K760 Fatty (change of) liver, not elsewhere classified: Secondary | ICD-10-CM | POA: Diagnosis not present

## 2015-03-10 DIAGNOSIS — N400 Enlarged prostate without lower urinary tract symptoms: Secondary | ICD-10-CM | POA: Diagnosis not present

## 2015-03-10 DIAGNOSIS — Z515 Encounter for palliative care: Secondary | ICD-10-CM | POA: Diagnosis not present

## 2015-03-10 DIAGNOSIS — B379 Candidiasis, unspecified: Secondary | ICD-10-CM | POA: Diagnosis not present

## 2015-03-10 DIAGNOSIS — C719 Malignant neoplasm of brain, unspecified: Secondary | ICD-10-CM | POA: Diagnosis not present

## 2015-03-10 DIAGNOSIS — R4182 Altered mental status, unspecified: Secondary | ICD-10-CM | POA: Diagnosis not present

## 2015-03-10 DIAGNOSIS — J189 Pneumonia, unspecified organism: Secondary | ICD-10-CM | POA: Diagnosis not present

## 2015-03-10 DIAGNOSIS — I2510 Atherosclerotic heart disease of native coronary artery without angina pectoris: Secondary | ICD-10-CM | POA: Diagnosis not present

## 2015-03-10 DIAGNOSIS — K219 Gastro-esophageal reflux disease without esophagitis: Secondary | ICD-10-CM | POA: Diagnosis not present

## 2015-03-10 DIAGNOSIS — E860 Dehydration: Secondary | ICD-10-CM | POA: Diagnosis not present

## 2015-03-10 DIAGNOSIS — R443 Hallucinations, unspecified: Secondary | ICD-10-CM | POA: Diagnosis not present

## 2015-03-10 DIAGNOSIS — N390 Urinary tract infection, site not specified: Secondary | ICD-10-CM | POA: Diagnosis not present

## 2015-03-10 DIAGNOSIS — R630 Anorexia: Secondary | ICD-10-CM | POA: Diagnosis not present

## 2015-03-10 DIAGNOSIS — R51 Headache: Secondary | ICD-10-CM | POA: Diagnosis not present

## 2015-03-10 DIAGNOSIS — R109 Unspecified abdominal pain: Secondary | ICD-10-CM | POA: Diagnosis not present

## 2015-03-10 DIAGNOSIS — K5900 Constipation, unspecified: Secondary | ICD-10-CM | POA: Diagnosis not present

## 2015-03-10 DIAGNOSIS — N289 Disorder of kidney and ureter, unspecified: Secondary | ICD-10-CM | POA: Diagnosis not present

## 2015-03-10 DIAGNOSIS — N179 Acute kidney failure, unspecified: Secondary | ICD-10-CM | POA: Diagnosis not present

## 2015-03-10 DIAGNOSIS — R531 Weakness: Secondary | ICD-10-CM | POA: Diagnosis not present

## 2015-03-10 DIAGNOSIS — Z510 Encounter for antineoplastic radiation therapy: Secondary | ICD-10-CM | POA: Diagnosis not present

## 2015-03-10 DIAGNOSIS — I517 Cardiomegaly: Secondary | ICD-10-CM | POA: Diagnosis not present

## 2015-03-10 DIAGNOSIS — I252 Old myocardial infarction: Secondary | ICD-10-CM | POA: Diagnosis not present

## 2015-03-10 DIAGNOSIS — K828 Other specified diseases of gallbladder: Secondary | ICD-10-CM | POA: Diagnosis not present

## 2015-03-10 DIAGNOSIS — R140 Abdominal distension (gaseous): Secondary | ICD-10-CM | POA: Diagnosis not present

## 2015-03-13 DIAGNOSIS — C719 Malignant neoplasm of brain, unspecified: Secondary | ICD-10-CM | POA: Diagnosis not present

## 2015-03-13 DIAGNOSIS — J189 Pneumonia, unspecified organism: Secondary | ICD-10-CM | POA: Diagnosis not present

## 2015-03-13 DIAGNOSIS — R4182 Altered mental status, unspecified: Secondary | ICD-10-CM | POA: Diagnosis not present

## 2015-03-13 DIAGNOSIS — N400 Enlarged prostate without lower urinary tract symptoms: Secondary | ICD-10-CM | POA: Diagnosis not present

## 2015-03-13 DIAGNOSIS — Z515 Encounter for palliative care: Secondary | ICD-10-CM | POA: Diagnosis not present

## 2015-03-13 DIAGNOSIS — R109 Unspecified abdominal pain: Secondary | ICD-10-CM | POA: Diagnosis not present

## 2015-03-13 DIAGNOSIS — K219 Gastro-esophageal reflux disease without esophagitis: Secondary | ICD-10-CM | POA: Diagnosis not present

## 2015-03-13 DIAGNOSIS — R630 Anorexia: Secondary | ICD-10-CM | POA: Diagnosis not present

## 2015-03-13 DIAGNOSIS — I517 Cardiomegaly: Secondary | ICD-10-CM | POA: Diagnosis not present

## 2015-03-13 DIAGNOSIS — N390 Urinary tract infection, site not specified: Secondary | ICD-10-CM | POA: Diagnosis not present

## 2015-03-13 DIAGNOSIS — E860 Dehydration: Secondary | ICD-10-CM | POA: Diagnosis not present

## 2015-03-13 DIAGNOSIS — R160 Hepatomegaly, not elsewhere classified: Secondary | ICD-10-CM | POA: Diagnosis not present

## 2015-03-13 DIAGNOSIS — N179 Acute kidney failure, unspecified: Secondary | ICD-10-CM | POA: Diagnosis not present

## 2015-03-13 DIAGNOSIS — R140 Abdominal distension (gaseous): Secondary | ICD-10-CM | POA: Diagnosis not present

## 2015-03-13 DIAGNOSIS — R51 Headache: Secondary | ICD-10-CM | POA: Diagnosis not present

## 2015-03-13 DIAGNOSIS — Z510 Encounter for antineoplastic radiation therapy: Secondary | ICD-10-CM | POA: Diagnosis not present

## 2015-03-13 DIAGNOSIS — K760 Fatty (change of) liver, not elsewhere classified: Secondary | ICD-10-CM | POA: Diagnosis not present

## 2015-03-13 DIAGNOSIS — N289 Disorder of kidney and ureter, unspecified: Secondary | ICD-10-CM | POA: Diagnosis not present

## 2015-03-13 DIAGNOSIS — I2510 Atherosclerotic heart disease of native coronary artery without angina pectoris: Secondary | ICD-10-CM | POA: Diagnosis not present

## 2015-03-13 DIAGNOSIS — R531 Weakness: Secondary | ICD-10-CM | POA: Diagnosis not present

## 2015-03-13 DIAGNOSIS — K5900 Constipation, unspecified: Secondary | ICD-10-CM | POA: Diagnosis not present

## 2015-03-13 DIAGNOSIS — R443 Hallucinations, unspecified: Secondary | ICD-10-CM | POA: Diagnosis not present

## 2015-03-13 DIAGNOSIS — I252 Old myocardial infarction: Secondary | ICD-10-CM | POA: Diagnosis not present

## 2015-03-13 DIAGNOSIS — B379 Candidiasis, unspecified: Secondary | ICD-10-CM | POA: Diagnosis not present

## 2015-03-13 DIAGNOSIS — K828 Other specified diseases of gallbladder: Secondary | ICD-10-CM | POA: Diagnosis not present

## 2015-03-19 NOTE — Consult Note (Signed)
Brief Consult Note: Patient was seen by consultant.   Comments: Consulted for abdominal discomfort and distention. Pt voices no such complaints. He says "at one time he had them," but specifically denies abdominal problems today. Abdomen protuberant, soft, nontender, cannot assess distention as I have no baseline. 02/13/15 U/A c/w UTI: 21 WBCs/hpf and 2+ LE 02/16/15 Abd XRays and U/S normal. Will sign off. Please reconsult if he has any objective signs of surgical illness.  Electronic Signatures: Consuela Mimes (MD)  (Signed 31-Mar-16 16:42)  Authored: Brief Consult Note   Last Updated: 31-Mar-16 16:42 by Consuela Mimes (MD)

## 2015-03-19 NOTE — Discharge Summary (Signed)
PATIENT NAME:  Seth Heath, Seth Heath MR#:  [id number] DATE OF BIRTH:  February 10, 1946  DATE OF ADMISSION:  02/17/2015 DATE OF DISCHARGE:  02/23/2015  DISCHARGE DIAGNOSIS:  1.  Glioblastoma multiforme.   2.  Altered mental status.  3.  Acute renal failure.  4.  Abdominal distention.  5.  Aspiration pneumonia.  6.  Thrush.   CONSULTING PHYSICIANS:  Micheline Maze, MD Lillette Boxer. Diona Fanti, MD   Consuela Mimes, MD   Altha Harm from Palliative Care Medicine   PROCEDURES: Right femoral vein central line access 02/16/2015.    CHIEF COMPLAINT: The patient is a 69 year old gentleman with glioblastoma multiforme who was admitted from the medical oncology clinic on 02/16/2015 with altered mental status, increased liver function test, a presumed UTI, and abdominal distention.   HOSPITAL COURSE:  1.  Neurology - The patient was admitted to the hospital with altered mental status. He has known glioblastoma multiforme undergoing radiation and concurrent Temodar in the outpatient department. Head CT on 02/13/2015 noted improvement in his disease. Radiation was initially held secondary to his altered mental status but restarted on 02/21/2015 once his mentation improved.   The patient's altered mental status was felt multifactorial secondary to his infection and dehydration. Electrolytes, ammonia, and TSH were normal. Oxygen saturations and CO2 were normal. His mental status improved with resolution of his infection.   2.  Renal/Fluids, Electrolytes and Nutrition-  The patient was admitted with dehydration. Initial BUN was 31 with a creatinine of 1.52. He received IV fluids. His renal function improved with a BUN of 19 and a creatinine of 1.04 on 02/20/2015.   The patient had apparent bladder spasms. A Foley catheter was placed with resolution of symptoms. Prior to discharge his catheter was removed. Initial urine revealed white cells. Cultures were negative. He was not felt to have a urinary tract  infection.   The patient was initially place NPO.  He received IV fluids.  His diet was advanced.  At the time of his discharge he was eating regular meals, although his appetite was know to fluctate.  3.  Infectious disease-  The patient was initially placed on Zosyn for broad-spectrum antibiotic coverage. Chest x-ray revealed bilateral lower lobe infiltrates, which were improving at the time of discharge. He was felt to possibly have an aspiration pneumonia.  All cultures were negative.  The patient was diagnosed with thrush. He was prescribed Magic Mouthwash. Ritta Slot was resolving at the time of discharge.   4.  Gastroenterology-  The patient has had chronic abdominal pain and distention in the outpatient department. On 02/16/2015, plain films and abdominal ultrasound were negative.Abdominal CT scan on 02/18/2015 revealed hepatic steatosis and hepatomegaly as well as bibasilar air space disease (left greater than right). There were no acute findings. Regarding his hepatomegaly and altered mental status, patient had additional testing with ferritin, B12, and copper/ceruloplasmin. There was no evidence of Wison's disease.  Ferritin was elevated likely secondary to an acute phase reactant (his iron saturations were normal). B12 was pending at the time of dictation.  By his daughter's report, he has a significant history of alcohol use.    DISCHARGE PHYSICAL EXAMINATION:   GENERAL: At discharge the patient is near his baseline.  VITAL SIGNS: Temperature 97.7, pulse 83, respiratory rate 18, blood pressure 155/77, O2 saturation 94%.   HEAD: Face symmetric. No cushingoid features.  EYES: Pupils equal, round, reactive to light and accommodation. No conjunctivitis or scleral icterus.  EARS, NOSE, MOUTH, AND THROAT: Thrush (  improving). No other oral lesions.  NECK:  Supple without adenopathy.  CARDIOVASCULAR: Regular rate and rhythm without murmur, rub, or gallop.  LUNGS: Clear to auscultation with  decreased breath sounds at the bases.  ABDOMEN: Fully round, soft, nontender with active bowel sounds. Hepatomegaly. No guarding or rebound tenderness.  LYMPH NODES: No palpable cervical, supraclavicular, axillary, or inguinal adenopathy.  EXTREMITIES: No edema.  NEUROLOGIC: Back to baseline. Cranial nerves intact. Strength and sensation intact.   DISCHARGE MEDICATIONS: Omeprazole 20 mg a day, nystatin 5 mL swish and spit 4 times a day, Temodar 140 mg a day, ondansetron 8 mg p.o. q. 12 hours p.r.n. nausea, multivitamin one a day, amlodipine 5 mg a day, Decadron 4 mg a day, acetaminophen 325/hydrocodone 5 mg 1 tablet every 6 hours as needed for pain, alprazolam 0.25 mg p.o. q. 8 hours p.r.n. anxiety, simethicone 80 mg 4 times a day, Augmentin 875/125 mg q. 12 hours.    ALLERGIES: No known drug allergies.    PLAN:  1.  Glioblastoma multiforme -  He will continue outpatient radiation on a Monday through Friday basis. His Decadron will be tapered by Dr. Baruch Gouty of radiation therapy. He will continue his daily Temodar.   2.  Pneumonia -  He will complete a 4 day course of outpatient Augmentin to complete a 10 day course for his aspiration pneumonia. He will continue Nystatin swish and spit for his thrush.   3.  Disposition. The patient is discharged to [facility] where he previously resided before his admission. He will be seen Monday through Friday in the radiation oncology clinic. He will be seen in follow up on Monday, April 11, in the medical oncology clinic.   4.  Code status. The patient's code status was DNR during his admission.    ____________________________ Drue Second Mike Gip, MD mcc:AT D: 02/24/2015 05:30:00 ET T: 02/24/2015 06:16:19 ET JOB#: [id number]  cc: Melissa C. Mike Gip, MD, <Dictator> Armstead Peaks, MD Unknown cc  Lequita Asal MD ELECTRONICALLY SIGNED 02/24/2015 13:18

## 2015-03-19 NOTE — Op Note (Signed)
PATIENT NAME:  Seth Heath, SETO MR#:  [id number] DATE OF BIRTH:  May 21, 1946  DATE OF PROCEDURE:  02/16/2015  PREOPERATIVE DIAGNOSIS: Cranial glioblastoma.   POSTOPERATIVE DIAGNOSIS: Cranial glioblastoma, lack of intravenous access.  PROCEDURE PERFORMED: Insertion right common femoral vein central line under ultrasound guidance.   SURGEON: Dia Crawford, MD  ANESTHESIA: Local.   OPERATIVE PROCEDURE: The patient is admitted and requiring IV access for treatment of his progressive glioblastoma. He was uncooperative, combative, moving around the bed and after interrogation of his right neck where he did have a large vein it was apparent we would be unable to keep him in any sort of safe position to accomplish the procedure. For that reason, we chose the right common femoral area. That area was interrogated with the ultrasound. There did appear to be a patent compressible non-thrombotic common femoral vein. The area was prepped with ChloraPrep and draped with sterile towels. Then 1% Xylocaine was used for anesthesia. Using ultrasound guidance, the vein was cannulated on a single pass and the wire passed into the great vessel system without difficulty. The needle was removed, the skin incision enlarged, and the vein dilator placed over the wire into the venous system and then the dilator removed. A triple-lumen catheter was then inserted over the wire into the venous system. It was secured in place over a BioSpot patch sutured in place using 3-0 silk. Sterile dressing was applied and all of the ports were flushed and aspirated.   ____________________________ Micheline Maze, MD rle:sb D: 02/16/2015 22:04:00 ET T: 02/17/2015 10:59:23 ET JOB#: [id number]  cc: Rodena Goldmann III, MD, <Dictator> Rodena Goldmann MD ELECTRONICALLY SIGNED 02/20/2015 20:08

## 2015-03-19 NOTE — Consult Note (Signed)
Present Illness 69 year old male w/ GBM, NH patient/DNR admitted for FTT. I was asked by Dr Inez Pilgrim to assess for GU etiologies of intermittent sharp nonspecific abdominal pain. Pt lethargic, not a good historian at present. He c/o sudden sharp lower abdominal pain, perhaps associated with urination. No blood in urine or dysuria. Apparently no prior h/o GU illnesses. CT A/P shows normal bladder/renal appearanceas, no urolithiasis. Bladder scan volumes approx 240 ml. Pt apparently not incontinent. Nurses notee most pain associated w/ moving pt.    No Known Allergies:   Case History and Physical Exam:  Chief Complaint Abdominal Pain   Past Medical Health Coronary Artery Disease, Hypertension, Cancer   Past Surgical History Coronary Artery Bypass Graft   HEENT PERLA   Neck/Nodes Supple   Chest/Lungs Clear   Breasts Not examined   Cardiovascular No Murmurs or Gallops   Abdomen Distended/tympanitic. Small tender reduceable umbilical hernia.   Genitalia WNL  Phallus uncirc. Foreskin nml   Rectal NST. No rectal masses/impaction. Prostate 30 gm. Nonnodular   Neurological Not examined   Skin Warm  Dry   CT:    02-Apr-16 16:19, CT Abdomen and Pelvis Without Contrast  CT Abdomen and Pelvis Without Contrast   REASON FOR EXAM:    (1) abdominal pain and distention; (2) suprapubic   pain, abdominal distention, dy  COMMENTS:       PROCEDURE: CT  - CT ABDOMEN AND PELVIS W0  - Feb 18 2015  4:19PM     CLINICAL DATA:  Abdominal pain and distension. Tenderness to  palpation.    EXAM:  CT ABDOMEN AND PELVIS WITHOUT CONTRAST    TECHNIQUE:  Multidetector CT imaging of the abdomen and pelvis was performed  following the standard protocol without IV contrast.  COMPARISON:  Plain films of 02/16/2015. Ultrasound 02/16/2015. CT of  09/20/2010.    FINDINGS:  Lower chest: Motion degradation. Also degraded by patient arm  position. Left greater than right basilar airspace disease.  Mild  cardiomegaly with LAD coronary artery atherosclerosis. No  pericardial or pleuraleffusion.    Hepatobiliary: Marked hepatic steatosis. Moderate hepatomegaly, 19.7  cm craniocaudal. Motion degradation continuing into the upper  abdomen. Relative hyperattenuation involving the subcapsular medial  left lobe of the liver on image 23 of series 2. Corresponds to a  probable flash fill hemangioma with surrounding perfusion anomaly on  the prior exam. Gallbladder distension, without calcified stone or  pericholecystic edema. No biliary ductal dilatation.    Pancreas: Normal, without mass or ductal dilatation.    Spleen: Normal    Adrenals/Urinary Tract: Normal adrenal glands. Renal cortical  scarring bilaterally is mild. No renal calculi or hydronephrosis. No  hydroureter or ureteric calculi. No bladder calculi.    Stomach/Bowel:Underdistended proximal stomach. Scattered colonic  diverticula. Normal terminal ileum and appendix. Normal small bowel.    Vascular/Lymphatic: Aortic and branch vessel atherosclerosis. A  right-sided femoral line terminates at right common iliac vein. No  abdominopelvic adenopathy.    Reproductive: Mild prostatomegaly.    Other: No significant free fluid.    Musculoskeletal: No acute osseous abnormality.     IMPRESSION:  1. Mild motion degradation.  2. No explanation for patient's symptoms.  3. Hepatic steatosis and hepatomegaly.  4. Left greater than right bibasilar airspace disease. On the left,  most consistent with infection or aspiration. The right-sided  opacity is favored to also represent infection or aspiration.  Atelectasis felt less likely.  5.  Atherosclerosis, including within the coronary arteries.  6. Mild gallbladder distention, without specific evidence of acute  cholecystitis.      Electronically Signed    By: Abigail Miyamoto M.D.    On: 02/18/2015 16:33         Verified By: Areta Haber, M.D.,    Impression Abdominal  pain. Doubt GU source although he may be having bladder spasms. I reviewed CT images--I am not impressed w/ any GU abnormalities.  I will have nursing check bladder scans. If large volumes remaining after voids, would suggest thought be given to placing foley or performing I/O catheterizatio to see if this decreases pt's painful episodes.  ml: [facility]   Electronic Signatures: Dahlstedt, Lillette Boxer (MD)  (Signed 03-Apr-16 11:44)  Authored: General Aspect/Present Illness, Allergies, History and Physical Exam, Radiology, Impression/Plan   Last Updated: 03-Apr-16 11:44 by Jorja Loa (MD)

## 2015-03-19 NOTE — Consult Note (Signed)
PATIENT NAME:  Seth Heath, Seth Heath MR#:  [id number] DATE OF BIRTH:  [birth year]  DATE OF CONSULTATION:  02/16/2015  REFERRING PHYSICIAN:  Janak K. Choksi, MD  CONSULTING PHYSICIAN:  Earleen Newport. Volanda Napoleon, MD  REASON FOR CONSULTATION:  General medical management.   HISTORY OF PRESENT ILLNESS:  This 69 year old African-American man with past medical history of glioblastoma, coronary artery disease, hypertension, and hyperlipidemia is admitted from oncology clinic with increasing abdominal pain and altered mental status. He is unable to provide much history due to altered mental status at the time of my examination. I attempted to reach his daughter, Hoyle Sauer, without any success. He has recently been diagnosed with a urinary tract infection, and he has been on Keflex for the past 3 days. His appetite has been intact. He has not had a vomiting, diarrhea, or constipation. He does report lower abdominal pain, but is unable to give any further details on this. Hospitalist services are asked to consult with the primary team on general medical management.   PAST MEDICAL HISTORY: 1.  Glioblastoma followed by Dr. Oliva Bustard.  2.  Gastroesophageal reflux disease.  3.  Coronary artery disease.  4.  History of pneumothorax x2.  5.  Hyperlipidemia.  6.  History of myocardial infarction.  7.  Hypertension.  8.  History of peptic ulcer disease.   SOCIAL HISTORY:  He currently resides at [facility]. He states that his daughter's name is Hoyle Sauer and that she helps coordinate his care. He quit smoking one month ago, but prior to that smoked 1 pack per day for 40 years. Denies alcohol abuse.   FAMILY MEDICAL HISTORY:  Positive for breast cancer in his mother and prostate cancer in his brother. He is unable to provide any other family medical history.   REVIEW OF SYSTEMS:  The patient is unable to provide a review of systems due to somewhat altered mental status at this time.   HOME MEDICATIONS: 1.  Keflex 500 mg 1  capsule twice a day.  2.  Zofran 8 mg 1 tablet twice a day.  3.  Bactrim DS 800 mg/160 mg 1 tablet 3 times a week on Mondays, Wednesdays, and Fridays.  4.  Temodar 140 mg 1 capsule once a day at bedtime.  5.  Nystatin 100,000 units per mL 5 mL swish and spit 4 times a day for thrush.  6.  Omeprazole 20 mg 1 capsule once a day.  7.  Amlodipine 5 mg 1 tablet once a day.  8.  Docusate sodium 100 mg 1 capsule twice a day.  9.  Lasix 20 mg 1 tablet once a day.  10.  Dexamethasone 2 mg orally once a day.   ALLERGIES:  No known allergies.   PHYSICAL EXAMINATION: VITAL SIGNS:  Temperature 98.1, pulse 98, respirations 20, blood pressure 135/89, oxygenation 93% on room air.  GENERAL:  No acute distress.  HEENT:  Pupils are equal, round, and reactive to light. Conjunctivae are clear. Extraocular motion is intact. Oral mucous membranes are dry. Posterior oropharynx is clear with no exudate, edema, or erythema. There is no thrush noted on my examination. Trachea is midline.   NECK:  Supple. No cervical lymphadenopathy.  RESPIRATORY:  Lungs are clear to auscultation bilaterally with good air movement.  CARDIOVASCULAR:  Regular rate and rhythm. No murmurs, rubs, or gallops. No peripheral edema. Peripheral pulses are 2+. No JVD. No bruit.  ABDOMEN:  Distended, tender, seems to be more tender in the suprapubic area. There is some guarding  in the lower abdominal quadrants. No rebound tenderness. Bowel sounds are decreased.  MUSCULOSKELETAL:  No joint effusions. Range of motion is normal. Strength is 5/5 throughout.  SKIN:  Dry. No overt rashes, lesions, or open wounds.  NEUROLOGIC:  Cranial nerves are grossly intact. Strength and sensation are intact. Nonfocal. The patient does seem fairly disoriented, unable to give a good history, slow to respond to questions.  PSYCHIATRIC:  Hard to assess due to altered mental status. No signs of overt anxiety or depression at this time.   LABORATORY DATA:  Sodium 135,  potassium 4.0, chloride 101, bicarbonate 25, BUN 31, creatinine 1.52, glucose 148. LFTs are normal with the exception of an elevated ALT at 74. Troponin is slightly elevated at 0.05. White blood cells are 11.9, hemoglobin 13.4, platelets 204,000, and MCV 89.   IMAGING:  Three-way abdominal imaging shows no acute findings in the abdomen. There is bibasilar atelectasis.   Ultrasound of the abdomen shows no cholelithiasis or cholecystitis. No acute abnormality. Kidneys appear normal.   ASSESSMENT AND PLAN: 1.  Recent urinary tract infection. Urinalysis from March 28 shows 21 white blood cells per high-powered field. He has been on Keflex for this. Repeat urinalysis with culture has been ordered. Blood culture has been ordered. He has been started on Cipro and Zosyn prophylactically due to his immunocompromised status.  2.  Altered mental status. I am unable to really establish a baseline mental status for him. I am reading the oncology note. He is more confused than usual and this is not likely due to glioblastoma. He likely has metabolic encephalopathy, possibly due to infection. I attempted to contact his daughter for further information but was unsuccessful. We will check a TSH and continue to monitor now that he has been treated with antibiotics.  3.  Abdominal pain. His abdominal x-rays do not show any acute abnormalities. Ultrasound is also negative. I have ordered a postvoid residual to assess his urine clearance. His abdomen is grossly distended, and this could possibly be his bladder. I have asked the nurse to perform the study as soon as possible, and if this is the case, a Foley catheter would be helpful. Surgery has also been consulted. If abdominal pain and distention have not improved by the morning, I would recommend CT scan particularly if we were able to improve his renal function.  4.  Acute renal failure. His baseline creatinine is 1.0 and today is 1.52. This may be due to decreased p.o.  intake in the setting of infection. I have ordered a repeat urinalysis, which is not back yet. We will go ahead and provide IV hydration in case this is acute tubular necrosis due to volume depletion. Continue to monitor. Electrolytes are currently stable. I will hold his diuresis for today.  5.  Leukocytosis. This may be due to infection or possibly due to chronic steroids. He is on dexamethasone. Blood and urine cultures are pending. Currently, he is afebrile.  6.  Glioblastoma. Continue treatment per oncology team.  7.  Gastroesophageal reflux disease. Agree with continuing proton pump inhibitor.  8.  Hypertension. I am holding his Lasix due to renal failure. Continue with amlodipine for blood pressure management.   DISPOSITION:  I am placing a palliative care consultation for this 69 year old man with history of glioblastoma, who seems to be a full code at this time. Goals of care discussion may be useful at this time.   Thank you for this consultation. We will continue to follow with  you.   TIME SPENT ON CONSULTATION:  40 minutes.    ____________________________ Earleen Newport. Volanda Napoleon, MD cpw:nb D: 02/16/2015 20:54:56 ET T: 02/16/2015 21:56:58 ET JOB#: [id number]  cc: Earleen Newport. Volanda Napoleon, MD, <Dictator> Aldean Jewett MD ELECTRONICALLY SIGNED 02/25/2015 14:38

## 2015-03-19 NOTE — Discharge Summary (Signed)
PATIENT NAME:  Seth Heath, Seth Heath MR#:  [id number] DATE OF BIRTH:  November 01, 1970  DATE OF ADMISSION:  02/16/2015 DATE OF DISCHARGE:  02/23/2015  DISCHARGE DIAGNOSIS:  1.  Glioblastoma multiforme.   2.  Altered mental status.  3.  Acute renal failure.  4.  Abdominal distention.  5.  Aspiration pneumonia.  6.  Thrush.   CONSULTING PHYSICIANS:  Micheline Maze, MD Lillette Boxer. Diona Fanti, MD   Consuela Mimes, MD   Altha Harm from Palliative Care Medicine   PROCEDURES: Right femoral vein central line access 02/16/2015.    CHIEF COMPLAINT: The patient is a 69 year old gentleman with glioblastoma multiforme who was admitted from the medical oncology clinic on 02/16/2015 with altered mental status, increased liver function test, a presumed UTI, and abdominal distention.   HOSPITAL COURSE:  1.  Neurology - The patient was admitted to the hospital with altered mental status. He has known glioblastoma multiforme undergoing radiation and concurrent Temodar in the outpatient department. Head CT on 02/13/2015 noted improvement in his disease. Radiation was initially held secondary to his altered mental status but restarted on 02/21/2015 once his mentation improved.   The patient's altered mental status was felt multifactorial secondary to his infection and dehydration. Electrolytes, ammonia, and TSH were normal. Oxygen saturations and CO2 were normal. His mental status improved with resolution of his infection.   2.  Renal/Fluids, Electrolytes and Nutrition-  The patient was admitted with dehydration. Initial BUN was 31 with a creatinine of 1.52. He received IV fluids. His renal function improved with a BUN of 19 and a creatinine of 1.04 on 02/20/2015.   The patient had apparent bladder spasms. A Foley catheter was placed with resolution of symptoms. Prior to discharge his catheter was removed. Initial urine revealed white cells. Cultures were negative. He was not felt to have a urinary tract  infection.   The patient was initially place NPO.  He received IV fluids.  His diet was advanced.  At the time of his discharge he was eating regular meals, although his appetite was know to fluctate.  3.  Infectious disease-  The patient was initially placed on Zosyn for broad-spectrum antibiotic coverage. Chest x-ray revealed bilateral lower lobe infiltrates, which were improving at the time of discharge. He was felt to possibly have an aspiration pneumonia.  All cultures were negative.  The patient was diagnosed with thrush. He was prescribed Magic Mouthwash. Ritta Slot was resolving at the time of discharge.   4.  Gastroenterology-  The patient has had chronic abdominal pain and distention in the outpatient department. On 02/16/2015, plain films and abdominal ultrasound were negative.Abdominal CT scan on 02/18/2015 revealed hepatic steatosis and hepatomegaly as well as bibasilar air space disease (left greater than right). There were no acute findings. Regarding his hepatomegaly and altered mental status, patient had additional testing with ferritin, B12, and copper/ceruloplasmin. There was no evidence of Wison's disease.  Ferritin was elevated likely secondary to an acute phase reactant (his iron saturations were normal). B12 was pending at the time of dictation.  By his daughter's report, he has a significant history of alcohol use.    DISCHARGE PHYSICAL EXAMINATION:   GENERAL: At discharge the patient is near his baseline.  VITAL SIGNS: Temperature 97.7, pulse 83, respiratory rate 18, blood pressure 155/77, O2 saturation 94%.   HEAD: Face symmetric. No cushingoid features.  EYES: Pupils equal, round, reactive to light and accommodation. No conjunctivitis or scleral icterus.  EARS, NOSE, MOUTH, AND THROAT: Thrush (  improving). No other oral lesions.  NECK:  Supple without adenopathy.  CARDIOVASCULAR: Regular rate and rhythm without murmur, rub, or gallop.  LUNGS: Clear to auscultation with  decreased breath sounds at the bases.  ABDOMEN: Fully round, soft, nontender with active bowel sounds. Hepatomegaly. No guarding or rebound tenderness.  LYMPH NODES: No palpable cervical, supraclavicular, axillary, or inguinal adenopathy.  EXTREMITIES: No edema.  NEUROLOGIC: Back to baseline. Cranial nerves intact. Strength and sensation intact.   DISCHARGE MEDICATIONS: Omeprazole 20 mg a day, nystatin 5 mL swish and spit 4 times a day, Temodar 140 mg a day, ondansetron 8 mg p.o. q. 12 hours p.r.n. nausea, multivitamin one a day, amlodipine 5 mg a day, Decadron 4 mg a day, acetaminophen 325/hydrocodone 5 mg 1 tablet every 6 hours as needed for pain, alprazolam 0.25 mg p.o. q. 8 hours p.r.n. anxiety, simethicone 80 mg 4 times a day, Augmentin 875/125 mg q. 12 hours.    ALLERGIES: No known drug allergies.    PLAN:  1.  Glioblastoma multiforme -  He will continue outpatient radiation on a Monday through Friday basis. His Decadron will be tapered by Dr. Baruch Gouty of radiation therapy. He will continue his daily Temodar.   2.  Pneumonia -  He will complete a 4 day course of outpatient Augmentin to complete a 10 day course for his aspiration pneumonia. He will continue Nystatin swish and spit for his thrush.   3.  Disposition. The patient is discharged to [facility] where he previously resided before his admission. He will be seen Monday through Friday in the radiation oncology clinic. He will be seen in follow up on Monday, April 11, in the medical oncology clinic.   4.  Code status. The patient's code status was DNR during his admission.    ____________________________ Drue Second Mike Gip, MD mcc:AT D: 02/24/2015 05:30:00 ET T: 02/24/2015 06:16:19 ET JOB#: [id number]  cc: Melissa C. Mike Gip, MD, <Dictator> Armstead Peaks, MD Unknown cc  Lequita Asal MD ELECTRONICALLY SIGNED 02/24/2015 13:18

## 2015-03-19 NOTE — Consult Note (Signed)
   Comments   Case discussed with Dr Oliva Bustard. Will hold on consult for now. Please reconsult if needed.   Electronic Signatures: Borders, Kirt Boys (NP)  (Signed 01-Apr-16 10:59)  Authored: Palliative Care   Last Updated: 01-Apr-16 10:59 by Irean Hong (NP)

## 2015-03-19 NOTE — Consult Note (Signed)
Reason for Visit: This 69 year old Male patient presents to the clinic for initial evaluation of  glioblastoma .   Referred by Dr. Mike Gip.  Diagnosis:  Chief Complaint/Diagnosis   69 year old male with GBM of right brain diagnosed with partial resection at [facility] grade 4. For partial brain radiation plus Temodar  Pathology Report pathology report requested for my review   Imaging Report MRI scans and CT scan without contrast reviewed   Referral Report clinical notes reviewed   Planned Treatment Regimen IM RT radiation therapy with concurrent Temodar   HPI   patient is a 69 year old male who presented with nosebleed as well as disorientation and balance issues. He was seen at [facility] emergency room head CT without contrast showed extensive vasogenic edema in the right frontal parietal and temporal lobes. He was transferred to [facility] where he had MRI performed showing a 3.3 irregular enhancing heterogeneous T2 lesion with areas of necrosis within the right parietal lobe and a 1.7 mass more inferior. Underwent biopsy for grade 4 GBM. 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 MRI scan on 11/26/2014 showed residual disease in the right frontal parietal lobes. He is currently been seen by medical oncology and plan is to go ahead with Temodar with concurrent radiation. Patient is using a walker now for assistance. Specifically.denies any focal motor or sensory level loss. By mouth intake is good. No change in visual fields.  Past Hx:    Pneumothorax x 2:    Heart murmur:    GERD:    Hypercholesterolemia:    Myocardial Infarct:    Hypertension:    stomach ulcers:   Past, Family and Social History:  Past Medical History positive   Cardiovascular hyperlipidemia; hypertension; myocardial infarction   Respiratory pneumothorax ??2   Gastrointestinal GERD; GI bleed; peptic ulcer disease   Family History positive   Family History Comments mother with breast  cancer at age 45, brother with prostate cancer daughter with breast cancer   Social History positive   Social History Comments 40-pack-year smoking history   Additional Past Medical and Surgical History accompanied by his daughter patient is currently a resident of [facility]   Allergies:   No Known Allergies:   Home Meds:  Home Medications: Medication Instructions Status  Temodar 140 mg oral capsule 1 cap(s) orally once a day (at bedtime) Active  nystatin 100000 units/mL oral suspension 5 milliliter(s) swish and spit 4 times a day for thrush Active  atorvastatin 10 mg oral tablet 1 tab(s) orally once a day (in the evening) Active  omeprazole 20 mg oral delayed release capsule 1 cap(s) orally once a day Active  amLODIPine 5 mg oral tablet 1 tab(s) orally once a day Active  multivitamin once a day  Active  dexamethasone 2 mg oral tablet  orally one tab once a day Active  docusate sodium sodium 100 mg oral capsule 1 cap(s) orally 2 times a day Active  Lasix 20 mg oral tablet 1 tab(s) orally once a day Active   Review of Systems:  General negative   Performance Status (ECOG) 1   Skin negative   Breast negative   Ophthalmologic negative   ENMT negative   Respiratory and Thorax negative   Cardiovascular negative   Gastrointestinal negative   Genitourinary negative   Musculoskeletal negative   Neurological see HPI   Psychiatric negative   Hematology/Lymphatics negative   Endocrine negative   Allergic/Immunologic negative   Nursing Notes:  Nursing Vital Signs and Chemo  Nursing Nursing Notes: *CC Vital Signs Flowsheet:   24-Feb-16 14:00  Temp Temperature 96.8  Pulse Pulse 90  Respirations Respirations 22  SBP SBP 141  DBP DBP 102  Pain Scale (0-10)  0  Current Weight (kg) (kg) 72.1  Height (cm) centimeters 167  BSA (m2) 1.8   Physical Exam:  General/Skin/HEENT:  General normal   Skin normal   Eyes normal   ENMT normal   Head and Neck  normal   Additional PE well-developed male in NAD using a walker for ambulatory assistance. HEENT reveals PERRLA, EOMI discs are visualized. Patient has arcus senilis. Cranial visual fields within normal range. Motor sensory and DTR levels are equal and symmetric in the upper and lower extremities. Proprioception is intact. Lungs are clear to A&P cardiac examination shows regular rate and rhythm.   Breasts/Resp/CV/GI/GU:  Respiratory and Thorax normal   Cardiovascular normal   Gastrointestinal normal   Genitourinary normal   MS/Neuro/Psych/Lymph:  Musculoskeletal normal   Neurological normal   Lymphatics normal   Other Results:  Radiology Results: LabUnknown:    05-Jan-16 20:23, CT Head Without Contrast  PACS Image   CT:  CT Head Without Contrast   REASON FOR EXAM:    confusion for 1 month, acute change in mental status   1 month ago, ? CVA  COMMENTS:       PROCEDURE: CT  - CT HEAD WITHOUT CONTRAST  - Nov 22 2014  8:23PM     CLINICAL DATA:  decreased mental status and difficulty understanding  x 2 weeks. No trauma    EXAM:  CT HEAD WITHOUT CONTRAST    TECHNIQUE:  Contiguous axial images were obtained from the base of the skull  through the vertex without intravenous contrast.  COMPARISON:  None.    FINDINGS:  Atherosclerotic and physiologicintracranial calcifications. There  is a poorly marginated hyperdense lesion measuring approximately 14  mm in the parietal lobe near the right lateral ventricle. There is a  large amount of regional vasogenic edema involving right occipital,  parietal, and temporal lobes, with regional effacement of sulci and  mild mass effect upon the right lateral ventricle. No acute  intracranial hemorrhage. No significant midline shift or herniation.  Cavum septum pellucidum. Bone windows reveal no calvariallesion.     IMPRESSION:  1. Hyperdense lesion in the right deep parietal lobe with a large  amount of regional vasogenic edema.  Primary considerations include  lymphoma versus metastatic disease such lung, or other  characteristically hyperdense metastases such as renal cell  carcinoma or melanoma. Recommend nonemergent MR head with contrast  for further characterization and to exclude additional lesions.      Electronically Signed    By: Arne Cleveland M.D.    On: 11/22/2014 20:38         Verified By: Kandis Cocking, M.D.,   Relevent Results:   Relevant Scans and Labs CT head without contrast reviewed as well as MRI from [facility]   Assessment and Plan: Impression:   GBM of right frontal parietal lobes in 69 year old male for concurrent Temodar and radiation therapy. Plan:   at this time like to go ahead with IM RT radiation therapy to his partial brain going up to 6000 cGy over 6 weeks. Would use IM RT to spare critical structures such as normal brain, optic chiasm, brainstem and hypo-campus. I will do an MRI CT simulation fusion for better delineation of the tumor volume. Risks and benefits of treatment including  cognitive decline, hair loss, skin reaction, alteration of blood counts all were discussed in detail with the patient. His daughter was present throughout and she seems to copperhead our treatment plan well. I set him up for CT simulation in order that for later this week along with MRI fusion study. We will coordinate his Temodar with medical oncology.  I would like to take this opportunity for allowing me to participate in the care of your patient..  Electronic Signatures: Armstead Peaks (MD)  (Signed 24-Feb-16 14:35)  Authored: HPI, Diagnosis, Past Hx, PFSH, Allergies, Home Meds, ROS, Nursing Notes, Physical Exam, Other Results, Relevent Results, Encounter Assessment and Plan   Last Updated: 24-Feb-16 14:35 by Armstead Peaks (MD)

## 2015-03-21 ENCOUNTER — Telehealth: Payer: Self-pay | Admitting: *Deleted

## 2015-03-21 NOTE — Telephone Encounter (Signed)
Hassan Rowan,  Temodar was for comfort.  I would discontinue drug.  I believe that Hospice should be called.  Nolon Stalls

## 2015-03-21 NOTE — Telephone Encounter (Signed)
Per Dr. Mike Gip call Hospice in

## 2015-03-21 NOTE — Telephone Encounter (Signed)
Has completed Rad therapy, He is refusing to take Temodar and all his other meds too. His condition has declined. Asking when he is to see Dr Mike Gip again. Needs to know if Temodar is for comfort or curative intent so they can call hospice back in for him

## 2015-03-21 NOTE — Telephone Encounter (Signed)
Informed Alyse Low of md orders

## 2015-04-19 DEATH — deceased

## 2015-04-20 ENCOUNTER — Ambulatory Visit: Payer: Self-pay | Admitting: Radiation Oncology

## 2016-09-27 IMAGING — CT CT HEAD WITHOUT CONTRAST
1 of 2 series · 14 of 30 positions shown, 18 images · non-contrast
Comparison: None.

CLINICAL DATA: decreased mental status and difficulty understanding
x 2 weeks. No trauma

EXAM:
CT HEAD WITHOUT CONTRAST
TECHNIQUE: Contiguous axial images were obtained from the base of the skull
through the vertex without intravenous contrast.

[Series 2: head wo · axial · 0.44mm/px · z∈[-122,+8]mm · 14 of 30 slices shown, 18 images]
[im 2/30  brain]
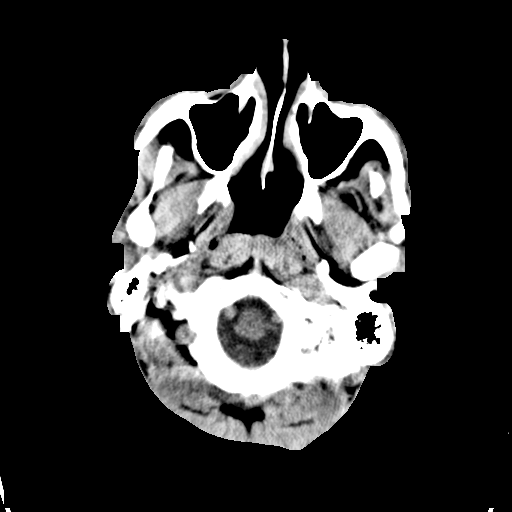
[im 2/30  bone]
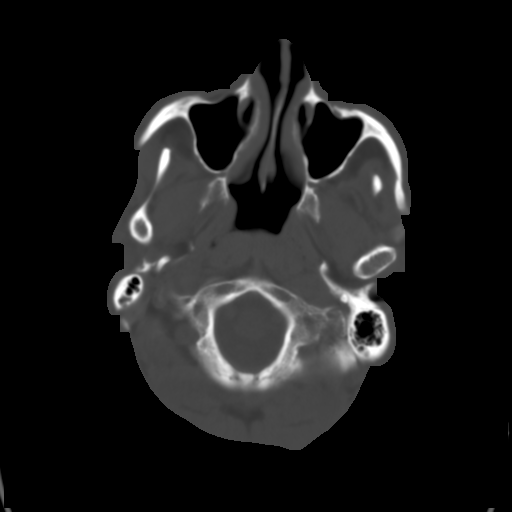
[im 4/30  brain]
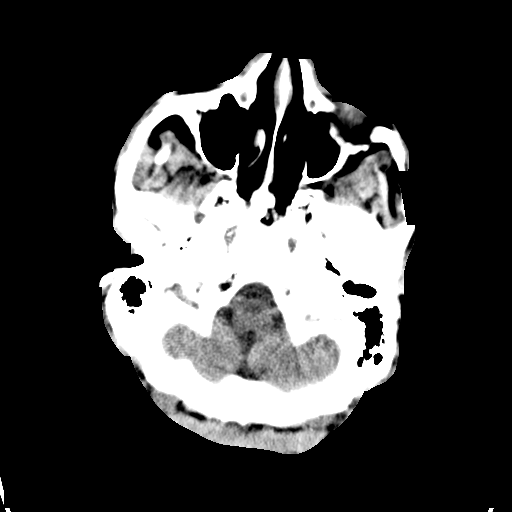
[im 6/30  brain]
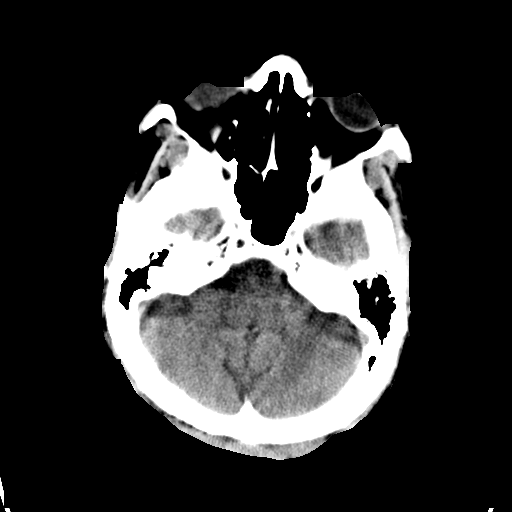
[im 8/30  brain]
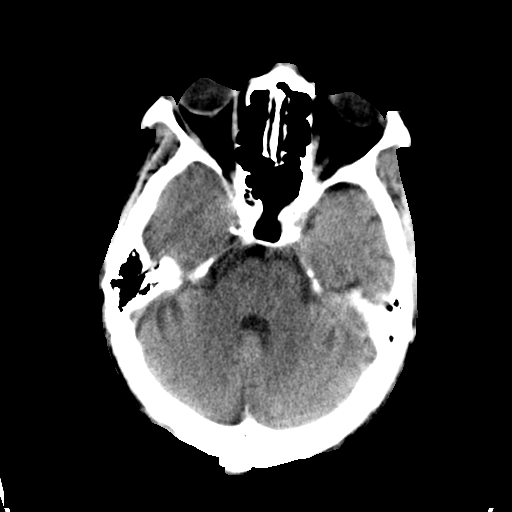
[im 10/30  brain]
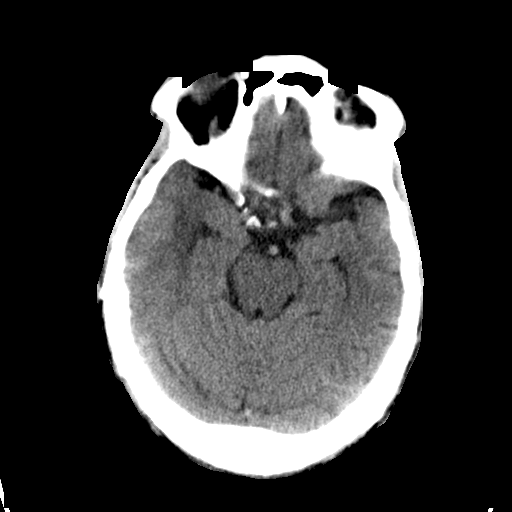
[im 10/30  bone]
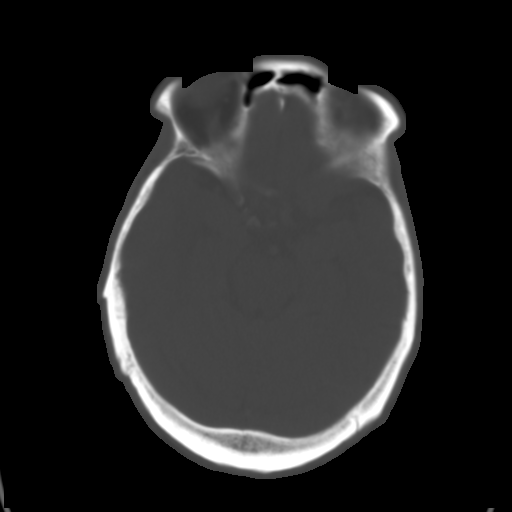
[im 12/30  brain]
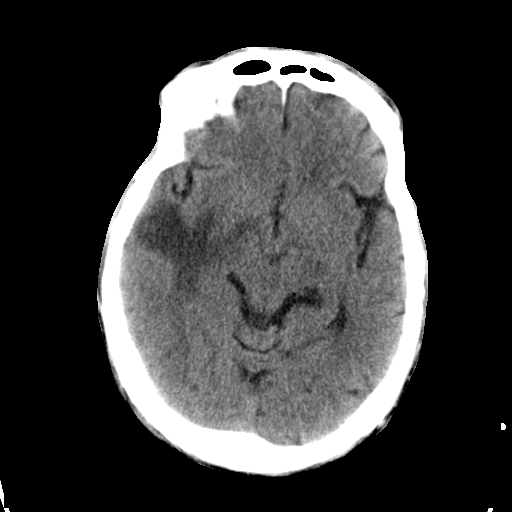
[im 14/30  brain]
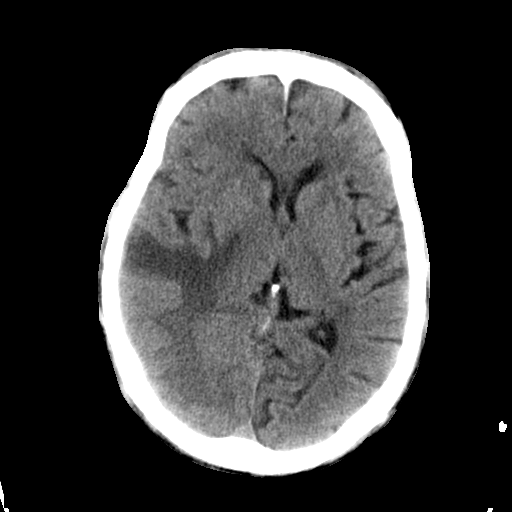
[im 16/30  brain]
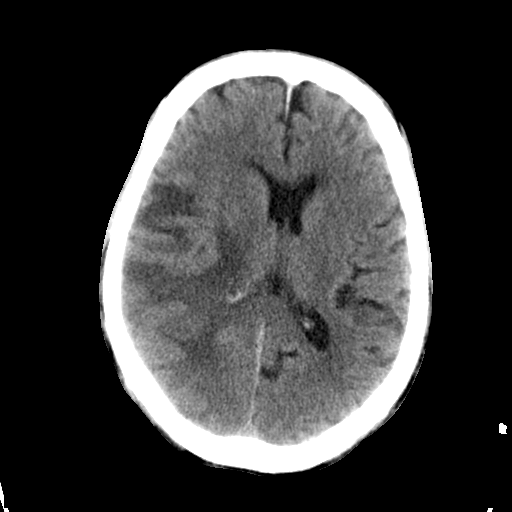
[im 18/30  brain]
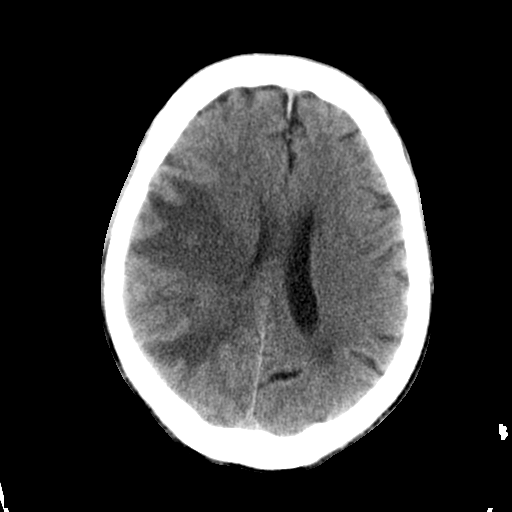
[im 18/30  bone]
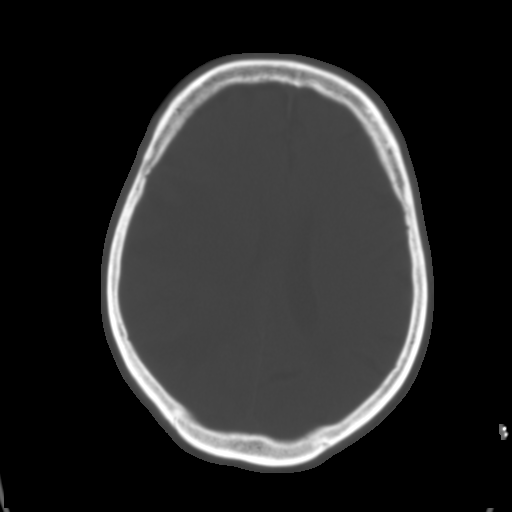
[im 20/30  brain]
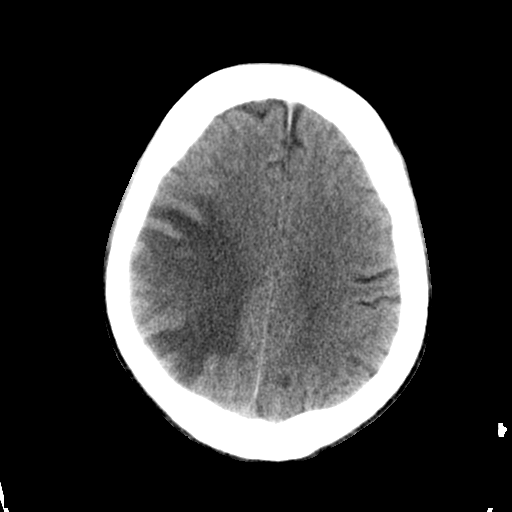
[im 22/30  brain]
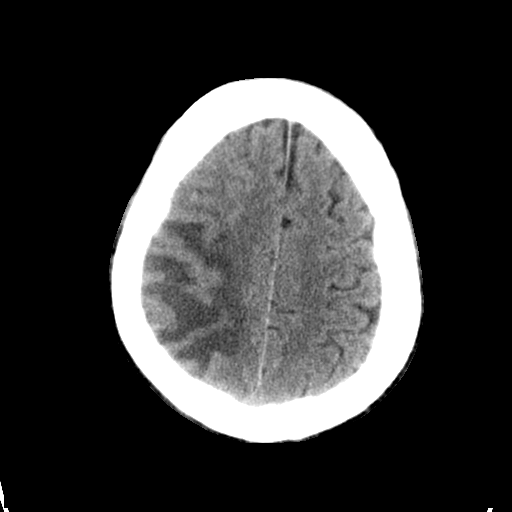
[im 24/30  brain]
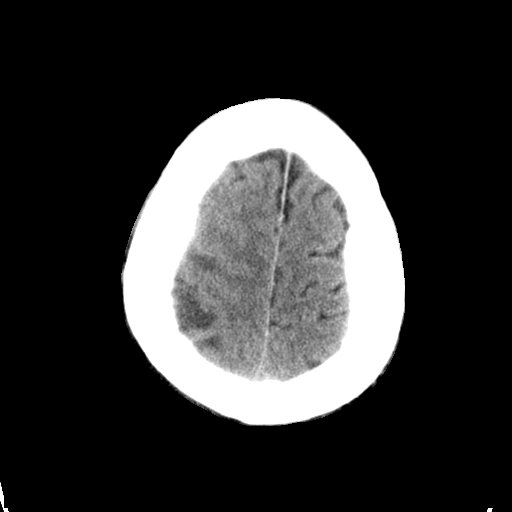
[im 26/30  brain]
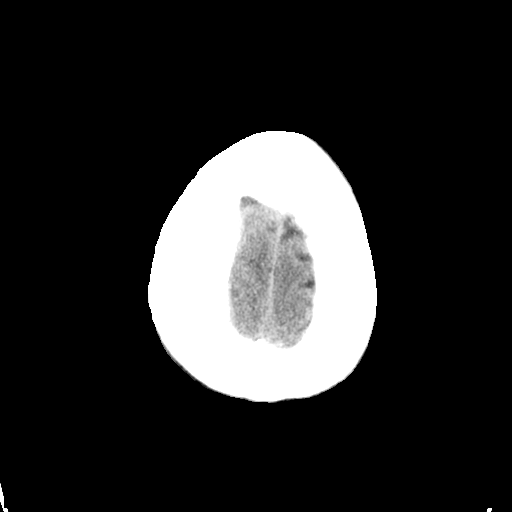
[im 26/30  bone]
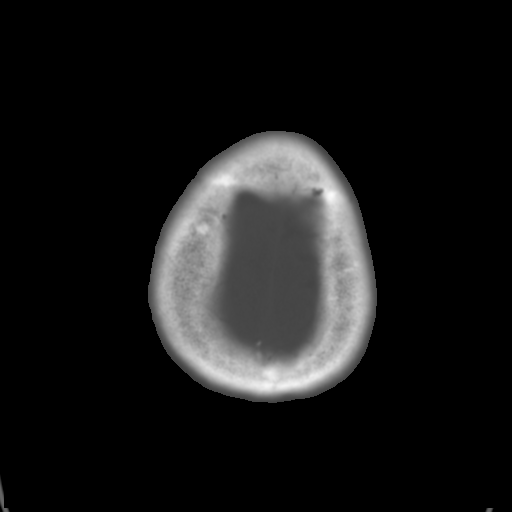
[im 28/30  brain]
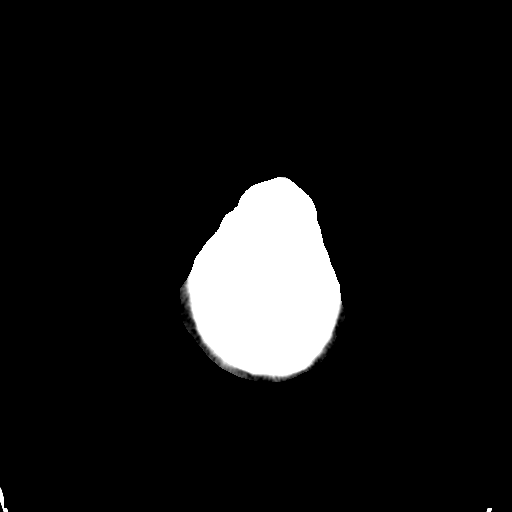

[14 of 30 positions shown; findings below may reference images not displayed]

FINDINGS: Atherosclerotic and physiologic intracranial calcifications. There
is a poorly marginated hyperdense lesion measuring approximately 14
mm in the parietal lobe near the right lateral ventricle. There is a
large amount of regional vasogenic edema involving right occipital,
parietal, and temporal lobes, with regional effacement of sulci and
mild mass effect upon the right lateral ventricle. No acute
intracranial hemorrhage. No significant midline shift or herniation.
Cavum septum pellucidum. Bone windows reveal no calvarial lesion.
IMPRESSION: 1. Hyperdense lesion in the right deep parietal lobe with a large
amount of regional vasogenic edema. Primary considerations include
lymphoma versus metastatic disease such lung, or other
characteristically hyperdense metastases such as renal cell
carcinoma or melanoma. Recommend nonemergent MR head with contrast
for further characterization and to exclude additional lesions.

## 2016-12-07 IMAGING — CR DG ABDOMEN 2V
3 series · 3 of 3 positions shown · non-contrast
Comparison: None.

CLINICAL DATA: Abdominal distention for 2 weeks

EXAM:
ABDOMEN - 2 VIEW

[abdomen erect]
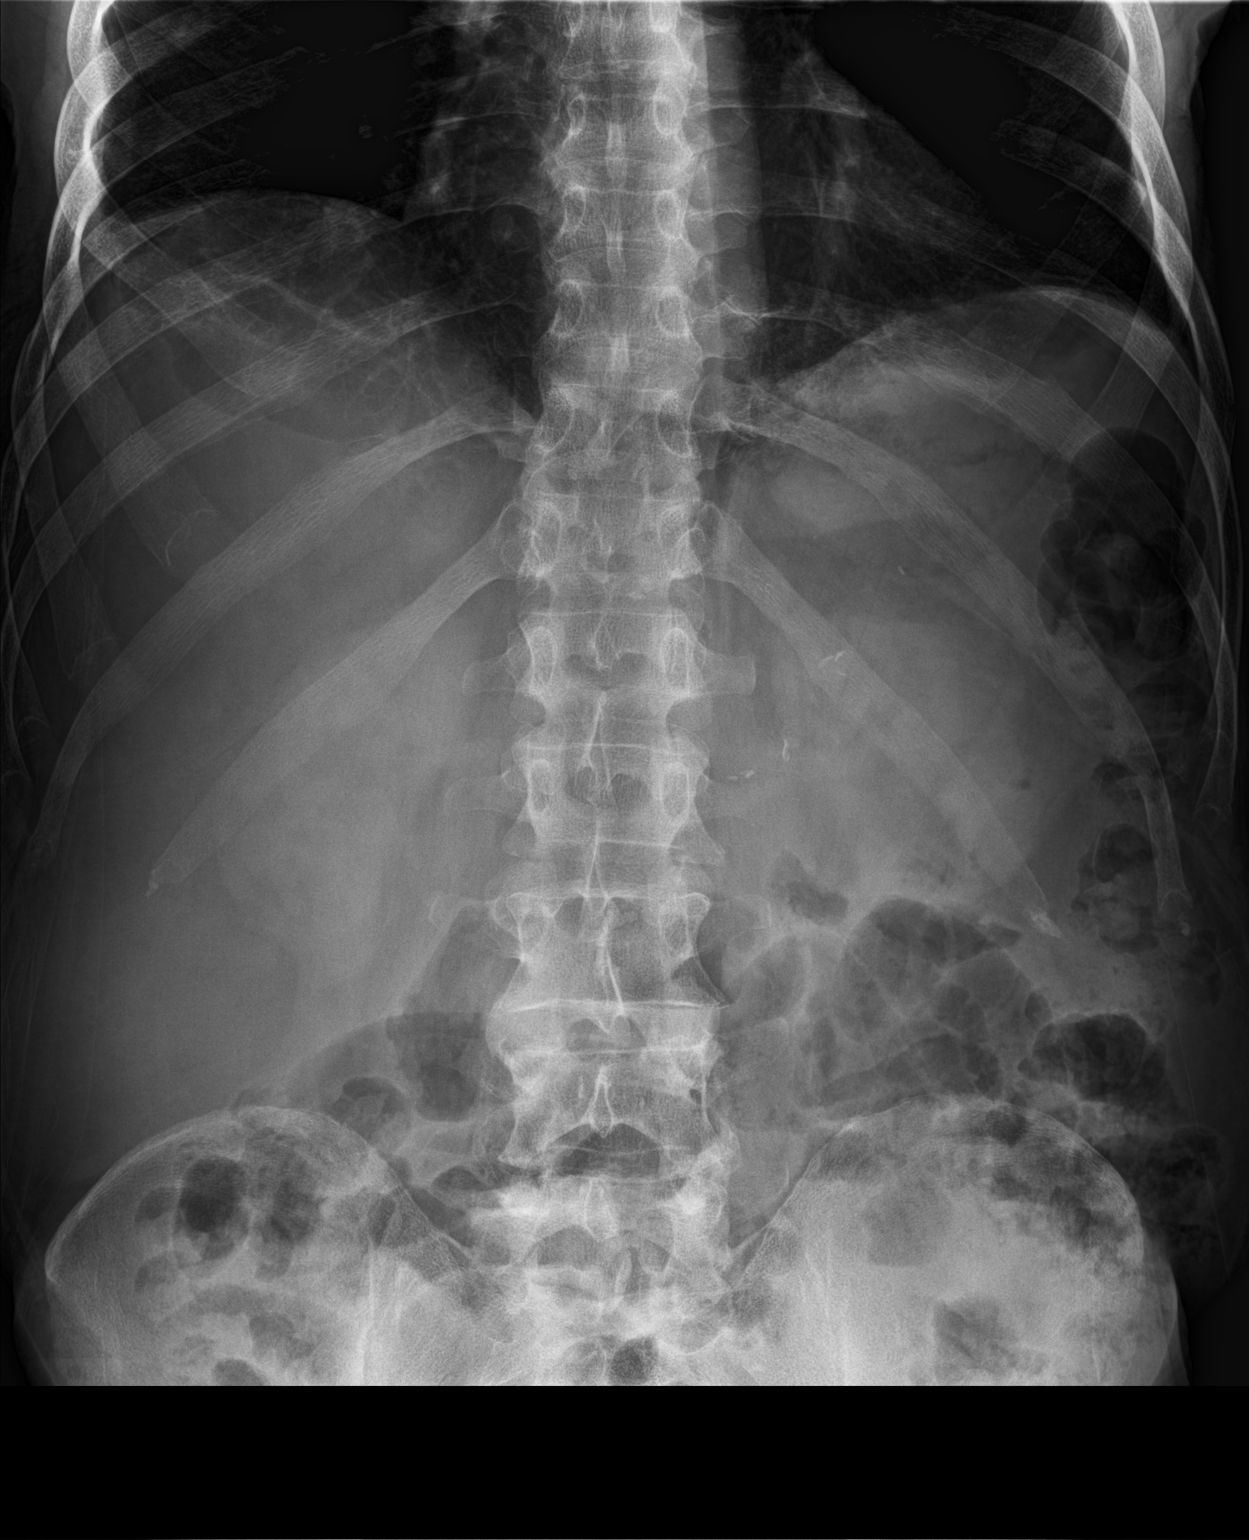

[abdomen supine (1 of 2)]
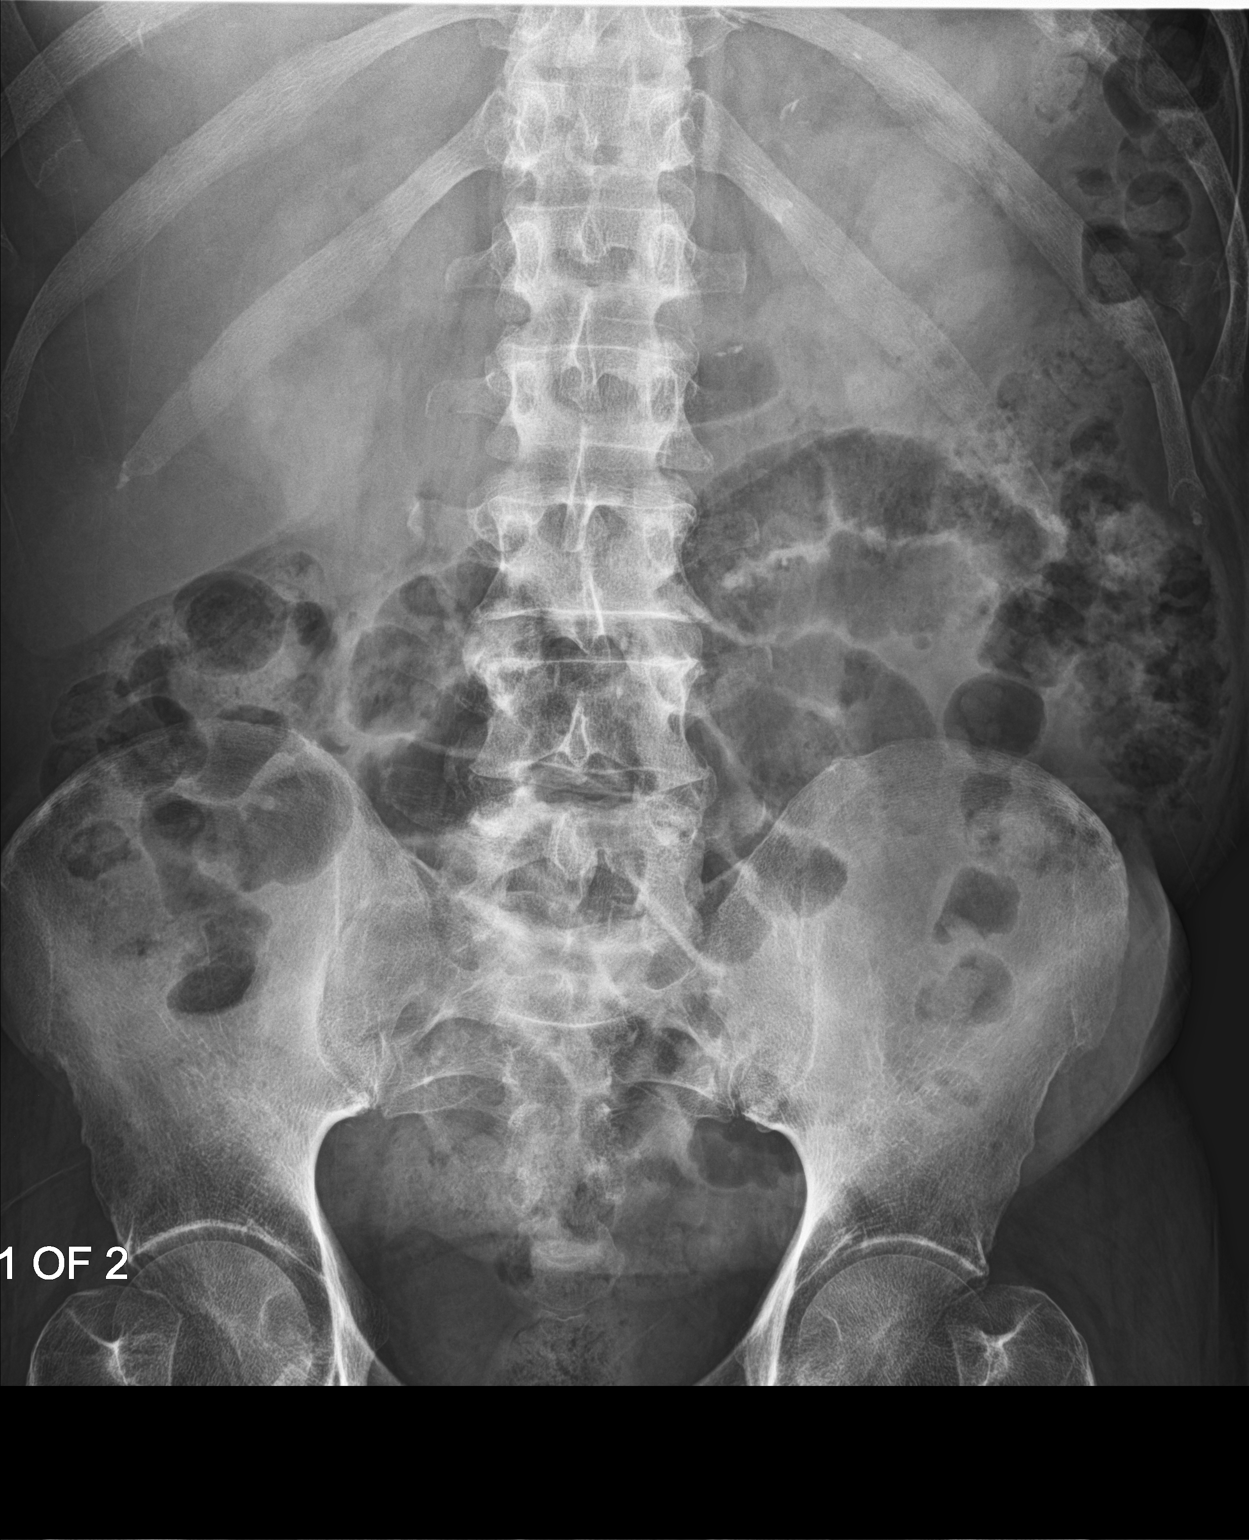

[abdomen supine (2 of 2)]
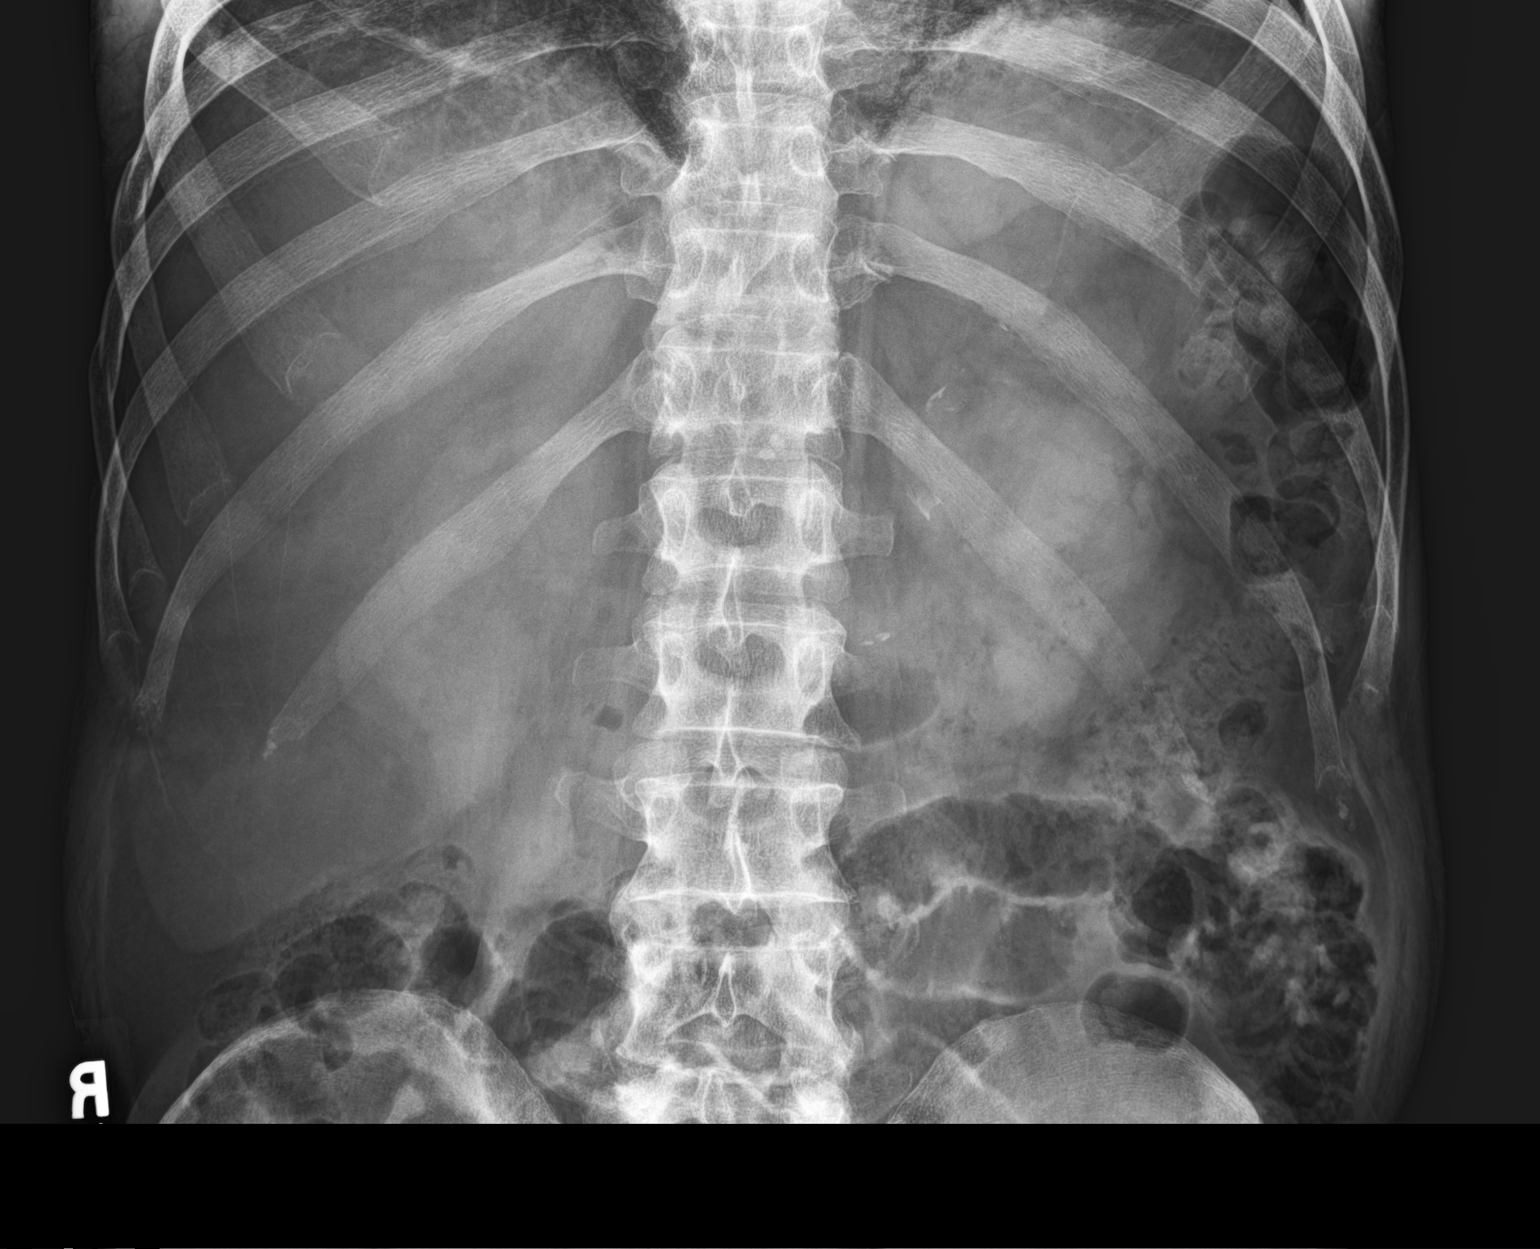

[3 of 3 positions shown; findings below may reference images not displayed]

FINDINGS: Gas-filled loops of small and large bowel are present in the lower
abdomen. There are no disproportionally dilated loops of bowel.
There is no free intraperitoneal gas. Vascular calcifications over
the left hemi abdomen are present. Soft tissues are within normal
limits.
IMPRESSION: Nonobstructive bowel gas pattern.  No free intraperitoneal gas.
